# Patient Record
Sex: Male | Born: 1940 | Race: White | Hispanic: No | Smoking: Former smoker
Health system: Southern US, Community
[De-identification: ages and names within clinical notes are randomized; demographics above are authoritative.]

## PROBLEM LIST (undated history)

## (undated) DIAGNOSIS — E119 Type 2 diabetes mellitus without complications: Secondary | ICD-10-CM

## (undated) DIAGNOSIS — S42301A Unspecified fracture of shaft of humerus, right arm, initial encounter for closed fracture: Secondary | ICD-10-CM

## (undated) DIAGNOSIS — I10 Essential (primary) hypertension: Secondary | ICD-10-CM

## (undated) DIAGNOSIS — I639 Cerebral infarction, unspecified: Secondary | ICD-10-CM

## (undated) DIAGNOSIS — J189 Pneumonia, unspecified organism: Secondary | ICD-10-CM

## (undated) DIAGNOSIS — C801 Malignant (primary) neoplasm, unspecified: Secondary | ICD-10-CM

## (undated) HISTORY — PX: SKIN CANCER EXCISION: SHX779

## (undated) HISTORY — PX: OTHER SURGICAL HISTORY: SHX169

## (undated) HISTORY — PX: CHOLECYSTECTOMY: SHX55

## (undated) HISTORY — PX: BACK SURGERY: SHX140

## (undated) HISTORY — PX: KNEE SURGERY: SHX244

## (undated) HISTORY — PX: TONSILLECTOMY: SUR1361

## (undated) HISTORY — PX: NECK SURGERY: SHX720

## (undated) HISTORY — PX: HERNIA REPAIR: SHX51

---

## 1998-04-13 ENCOUNTER — Ambulatory Visit (HOSPITAL_COMMUNITY): Admission: RE | Admit: 1998-04-13 | Discharge: 1998-04-13 | Payer: Self-pay | Admitting: *Deleted

## 1998-08-08 ENCOUNTER — Inpatient Hospital Stay (HOSPITAL_COMMUNITY): Admission: RE | Admit: 1998-08-08 | Discharge: 1998-08-10 | Payer: Self-pay | Admitting: Neurosurgery

## 2000-06-07 ENCOUNTER — Ambulatory Visit (HOSPITAL_COMMUNITY): Admission: RE | Admit: 2000-06-07 | Discharge: 2000-06-07 | Payer: Self-pay | Admitting: Family Medicine

## 2000-06-07 ENCOUNTER — Encounter: Payer: Self-pay | Admitting: Family Medicine

## 2003-03-29 ENCOUNTER — Encounter: Payer: Self-pay | Admitting: Emergency Medicine

## 2003-03-29 ENCOUNTER — Emergency Department (HOSPITAL_COMMUNITY): Admission: EM | Admit: 2003-03-29 | Discharge: 2003-03-29 | Payer: Self-pay | Admitting: Emergency Medicine

## 2003-04-13 ENCOUNTER — Encounter: Payer: Self-pay | Admitting: Otolaryngology

## 2003-04-13 ENCOUNTER — Ambulatory Visit (HOSPITAL_COMMUNITY): Admission: RE | Admit: 2003-04-13 | Discharge: 2003-04-13 | Payer: Self-pay | Admitting: Otolaryngology

## 2004-12-21 DIAGNOSIS — I639 Cerebral infarction, unspecified: Secondary | ICD-10-CM

## 2004-12-21 HISTORY — DX: Cerebral infarction, unspecified: I63.9

## 2005-06-15 ENCOUNTER — Emergency Department (HOSPITAL_COMMUNITY): Admission: EM | Admit: 2005-06-15 | Discharge: 2005-06-16 | Payer: Self-pay | Admitting: *Deleted

## 2005-07-29 ENCOUNTER — Ambulatory Visit: Payer: Self-pay | Admitting: Family Medicine

## 2005-09-16 ENCOUNTER — Ambulatory Visit: Payer: Self-pay | Admitting: Family Medicine

## 2005-11-11 ENCOUNTER — Ambulatory Visit: Payer: Self-pay | Admitting: Family Medicine

## 2005-12-30 ENCOUNTER — Ambulatory Visit: Payer: Self-pay | Admitting: Family Medicine

## 2006-03-30 ENCOUNTER — Ambulatory Visit: Payer: Self-pay | Admitting: Family Medicine

## 2006-08-02 ENCOUNTER — Ambulatory Visit: Payer: Self-pay | Admitting: Family Medicine

## 2006-12-09 ENCOUNTER — Ambulatory Visit: Payer: Self-pay | Admitting: Family Medicine

## 2007-02-24 ENCOUNTER — Ambulatory Visit: Payer: Self-pay | Admitting: Family Medicine

## 2007-03-08 ENCOUNTER — Ambulatory Visit: Payer: Self-pay | Admitting: Family Medicine

## 2007-03-16 ENCOUNTER — Ambulatory Visit: Payer: Self-pay | Admitting: Family Medicine

## 2007-03-31 ENCOUNTER — Ambulatory Visit: Payer: Self-pay | Admitting: Family Medicine

## 2007-04-08 ENCOUNTER — Ambulatory Visit (HOSPITAL_COMMUNITY): Admission: RE | Admit: 2007-04-08 | Discharge: 2007-04-08 | Payer: Self-pay | Admitting: Gastroenterology

## 2007-04-08 ENCOUNTER — Encounter (INDEPENDENT_AMBULATORY_CARE_PROVIDER_SITE_OTHER): Payer: Self-pay | Admitting: Specialist

## 2007-04-11 ENCOUNTER — Ambulatory Visit: Payer: Self-pay | Admitting: Family Medicine

## 2007-04-21 ENCOUNTER — Ambulatory Visit: Payer: Self-pay | Admitting: Family Medicine

## 2007-05-19 ENCOUNTER — Encounter: Payer: Self-pay | Admitting: Gastroenterology

## 2007-05-20 ENCOUNTER — Inpatient Hospital Stay (HOSPITAL_COMMUNITY): Admission: EM | Admit: 2007-05-20 | Discharge: 2007-05-21 | Payer: Self-pay | Admitting: Emergency Medicine

## 2007-05-20 ENCOUNTER — Encounter (INDEPENDENT_AMBULATORY_CARE_PROVIDER_SITE_OTHER): Payer: Self-pay | Admitting: Surgery

## 2007-05-31 ENCOUNTER — Ambulatory Visit: Payer: Self-pay | Admitting: Family Medicine

## 2007-06-26 ENCOUNTER — Emergency Department (HOSPITAL_COMMUNITY): Admission: EM | Admit: 2007-06-26 | Discharge: 2007-06-26 | Payer: Self-pay | Admitting: Emergency Medicine

## 2007-09-22 ENCOUNTER — Encounter: Admission: RE | Admit: 2007-09-22 | Discharge: 2007-09-22 | Payer: Self-pay | Admitting: Gastroenterology

## 2011-05-05 NOTE — Consult Note (Signed)
NAME:  George Jefferson, George Jefferson NO.:  [id number]   MEDICAL RECORD NO.:  [id number]          PATIENT TYPE:  INP   LOCATION:  6707                         FACILITY:  [facility]   PHYSICIAN:  Sharlet Salina T. Hoxworth, M.D.DATE OF BIRTH:  1941-02-21   DATE OF CONSULTATION:  05/19/2007  DATE OF DISCHARGE:                                 CONSULTATION   CHIEF COMPLAINT:  Abdominal pain and nausea   HISTORY OF PRESENT ILLNESS:  I was asked by Dr. Deretha Emory in the  emergency room to evaluate George Jefferson.  George Jefferson is a 70 year old male who  presents with worsening upper abdominal and right upper quadrant  abdominal pain and nausea.  George Jefferson states George Jefferson first began to have  intermittent abdominal pain in November of last year.  George Jefferson presented to  the emergency room at [facility] and was treated symptomatically  with acute upper abdominal pain.  This did not significantly recur until  late February or March of this year.  At that time George Jefferson developed flu  and subsequent pneumonia and received several courses of antibiotics for  a diagnosis of pneumonia.  At that time George Jefferson began to have increasingly  severe intermittent abdominal pain and nausea.  This was initially felt  to be secondary to antibiotics by his treating physicians.  However, his  pain has steadily worsened.  George Jefferson describes pressure or aching epigastric  and right upper quadrant pain associated with nausea.  The pain is made  much worse by eating.  It has become increasingly frequent severe and  over the past week has been essentially constant, although it is not as  bad if George Jefferson does not eat.  George Jefferson has quite a bit of nausea, but no vomiting.  George Jefferson has been constipated.  The patient has been seen by his primary  doctor, Dr. Lysbeth Galas and was referred to Dr. Loreta Ave.  A colonoscopy has been  performed which apparently just showed a small polyp, but was otherwise  negative.  George Jefferson represented today with increasing pain and was sent to the  emergency room for an  ultrasound which shows gallstones as described  below.  George Jefferson has not had any fever or chills.  No jaundice noted.  George Jefferson has  not had any cough, sputum production or shortness of breath.   PAST SURGICAL HISTORY:  1. Neck fusion x2.  2. Lumbar laminectomy and history of spinal stenosis.  3. Right carotid endarterectomy following a CVA.  4. Left inguinal hernia.  5. Tonsillectomy.  6. Knee arthroscopy.   PAST MEDICAL HISTORY:  1. Hypertension.  2. Elevated cholesterol.  3. History of CVA as above.   CURRENT MEDICATIONS:  Aspirin, folic acid, Toprol, Vytorin.   ALLERGIES:  None.   SOCIAL HISTORY:  George Jefferson is married and accompanied by his wife.  No  cigarette or alcohol use.   FAMILY HISTORY:  Noncontributory.   REVIEW OF SYSTEMS:  GENERAL:  Denies fever, chills.  RESPIRATORY:  Denies cough, sputum, shortness of breath.  CARDIOVASCULAR:  Chest pain,  palpitations, swelling or history of heart disease.  GI:  As above.  GU:  No urinary burning, frequency, blood.  MUSCULOSKELETAL:  George Jefferson has some  chronic back and joint pain.  NEURO:  No residual problems from stroke,  specifically numbness, weakness.  No syncope.   PHYSICAL EXAMINATION:  VITAL SIGNS:  Temperature is 97, heart rate 84,  blood pressure 164/82, respirations 18.  GENERAL:  Mildly overweight white male in no acute distress.  SKIN:  Warm and dry.  No rash or infection.  HEENT:  No palpable masses or thyromegaly.  Well-healed right neck  incision.  Sclerae nonicteric.  Nares and oropharynx clear.  LUNGS:  Clear bilaterally with no crackles or wheezing.  No increased  work of breathing.  CARDIAC:  Regular rate and rhythm.  No murmurs.  Peripheral pulses  intact.  No JVD.  No edema.  LYMPH NODES:  No cervical or subclavicular nodes palpable.  ABDOMEN:  Mildly obese.  There is localized epigastric and right upper  quadrant tenderness with some guarding.  No palpable masses or  hepatosplenomegaly.  EXTREMITIES:  No joint swelling,  deformity.  NEUROLOGIC:  Alert and oriented.  Motor and sensory is grossly normal.   LABORATORY:  White count normal at 6.7, hemoglobin 13.9, platelets 236.  Lipase 22, amylase 52.  Electrolytes and LFTs all within normal limits.   I reviewed the ultrasound which shows multiple small gallstones, no  gallbladder wall thickening or pericholecystic fluid, common bile duct  is normal.  A chest x-ray shows some haziness in the right lower lobe,  possible pneumonia.   ASSESSMENT/PLAN:  Worsening abdominal pain, nausea and now tenderness  all consistent with gallbladder disease.  George Jefferson has gallstones and likely  ongoing biliary colic or chronic cholecystitis, but I see no evidence of  acute cholecystitis on ultrasound or in regards to his lab work.  Chest  x-ray raises the possibility of right lower lobe pneumonia, although  again there is no fever, white count and chest exam is unremarkable.   The patient is being admitted to the medicine service.  George Jefferson will need  laparoscopic cholecystectomy and cholangiogram as his pneumonia is  improved or ruled out by medicine.      Lorne Skeens. Hoxworth, M.D.  Electronically Signed     BTH/MEDQ  D:  05/20/2007  T:  05/20/2007  Job:  [id number]

## 2011-05-05 NOTE — Discharge Summary (Signed)
NAME:  George Jefferson, George Jefferson NO.:  [id number]   MEDICAL RECORD NO.:  [id number]          PATIENT TYPE:  INP   LOCATION:  6707                         FACILITY:  [facility]   PHYSICIAN:  Ladell Pier, M.D.   DATE OF BIRTH:  29-Apr-1941   DATE OF ADMISSION:  05/19/2007  DATE OF DISCHARGE:  05/21/2007                               DISCHARGE SUMMARY   DISCHARGE DIAGNOSES:  1. Right upper quadrant pain secondary to gallbladder disease.  2. Pneumonia.  3. 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  4. Hypertension.  5. Dyslipidemia.   PAST SURGICAL HISTORY:  1. Neck surgery.  2. Back surgery.  3. Left inguinal hernia repair.  4. Left knee arthroscopy.  5. Tonsillectomy.  6. Right carotid endarterectomy, 2006.   DISCHARGE MEDICATIONS:  1. Aspirin 81 mg daily.  2. Folic acid 1 mg daily.  3. Vytorin 10/10 q.h.s.  4. Toprol XL 25 mg daily.  5. Mag citrate as needed.  6. Levaquin 500 mg daily for 7 days.  7. Vicodin p.r.n. for pain.   FOLLOWUP APPOINTMENTS:  1. The patient to follow up with general surgery in 3 weeks.  2. The patient to follow up with primary care physician in 1-2 weeks.   PROCEDURES:  Status post laparoscopic cholecystectomy.   CONSULTANTS:  General surgery.   HISTORY OF PRESENT ILLNESS:  The patient is a 70 year old white male who  came in with right upper quadrant pain, worse with eating.  He had seen  Dr. Loreta Ave and had a colonoscopy that was normal.  In February, he had  some flu-like symptoms and then developed pneumonia.  He had an  ultrasound in the emergency room that showed gallstones.   PAST MEDICAL HISTORY:  Per admission H&P.   FAMILY HISTORY:  Per admission H&P.   SOCIAL HISTORY:  Per admission H&P.   MEDICATIONS:  Per admission H&P.   ALLERGIES:  Per admission H&P.   REVIEW OF SYSTEMS:  Per admission H&P.   PHYSICAL EXAMINATION ON DISCHARGE:  VITAL SIGNS:  Temperature 97.7,  pulse 90, respirations 20, blood pressure 131/72, pulse of 92% on room  air.  HEENT:  Normocephalic, atraumatic.  Pupils equal, round, reactive to  light without erythema.  CARDIOVASCULAR:  Regular rate and rhythm.  LUNGS:  Clear bilaterally.  ABDOMEN:  Positive bowel sounds.  EXTREMITIES:  Without edema.   HOSPITAL COURSE:  1. Right upper quadrant pain - gallstones.  The patient was admitted.      He was taken to the OR the next day, and he had a cholecystectomy.      He did well with the procedure and general surgery thought the      patient was okay for discharge.  The patient was discharged home      with pain medication p.r.n.  2. Pneumonia.  He had a chest x-ray that showed infiltrate consistent      with pneumonia and followed up CAT  scan also showed pneumonia,      diffuse reticular nodular interstitial lung disease, COPD,      emphysema, chronic bronchitis, patchy opacity in the right middle      lobe and superior segment lobe looking like inflammation.  He was      treated with Levaquin in house and discharged on Levaquin for 7      days.  3. Hypertension.  He was continued on antihypertensive medications.      Blood pressure was stable on discharge.   DISCHARGE LABORATORY:  Blood cultures negative x2.  Sodium 135,  potassium 4.5, chloride 101, CO2 27, glucose 92, BUN 10, creatinine  0.96.  LFTs normal. WBC 6.2, hemoglobin 13.1, platelets 205.  Cholangiogram during surgery was normal.  Chest x-ray showed improved  right middle lobe variation and chest CT as mentioned above.  Ultrasound  showed sludge and small nonshadowing stones in the gallbladder, that  move freely with changes in position.      Ladell Pier, M.D.  Electronically Signed     NJ/MEDQ  D:  05/21/2007  T:  05/21/2007  Job:  [id number]   cc:   Delaney Meigs, M.D.

## 2011-05-05 NOTE — Op Note (Signed)
NAME:  George Jefferson, George Jefferson NO.:  [id number]   MEDICAL RECORD NO.:  [id number]          PATIENT TYPE:  INP   LOCATION:  6707                         FACILITY:  [facility]   PHYSICIAN:  Wilmon Arms. Corliss Skains, M.D. DATE OF BIRTH:  [birth year]   DATE OF PROCEDURE:  05/20/2007  DATE OF DISCHARGE:                               OPERATIVE REPORT   PREOPERATIVE DIAGNOSIS:  Acute cholecystitis   POSTOPERATIVE DIAGNOSIS:  Acute cholecystitis.   PROCEDURE PERFORMED:  Laparoscopic cholecystectomy with intraoperative  cholangiogram.   SURGEON:  Wilmon Arms. Tsuei, M.D.   ANESTHESIA:  General endotracheal.   INDICATIONS:  The patient is a 70 year old male who presents with  intermittent upper abdominal pain and nausea.  He has undergone a  thorough workup.  The symptoms had become quite severe recently.  He  presents to the emergency apartment where an ultrasound showed  gallstones.  We were consulted, and the patient was admitted to  hospital.  The patient has had recent pneumonia.  His chest x-ray and  chest CT scan show some residual signs of pneumonia, but the patient is  completely asymptomatic without sign of fever or cough.  He was admitted  to hospital, placed on antibiotics and are coming to the room now for  cholecystectomy.   DESCRIPTION OF PROCEDURE:  The patient was brought to the operating room  and placed in the supine position on the operating room table.  After an  adequate level of general anesthesia was obtained, the patient's abdomen  was prepped with Betadine and draped in sterile fashion.  A time-out was  taken to assure the proper patient and proper procedure.  The transverse  incision was made below his umbilicus after infiltrating with 0.25%  Marcaine.  Dissection was carried down to the fascia which was opened  vertically.  A stay suture of Vicryl was placed around the fascial  opening.  The Hasson cannula was inserted and secured with the stay  suture.   Pneumoperitoneum was obtained by insufflating CO2, maintaining  maximum, pressure of 15 mmHg.  The patient was tilted into reverse  Trendelenburg position, tilted to his left.  The laparoscope was  inserted.  The gallbladder was visualized, and it seemed distended but  not thickened.  There were dense omental adhesions to the gallbladder.  A 10-mm port was placed in subxiphoid position, and two 5-mm ports were  placed in the right upper quadrant.  The gallbladder was grasped,  clamped and elevated.  A small hole was inadvertently created in the  fundus and the bile decompressing the abdomen.  This area was thoroughly  irrigated and suction.  We were able to peel down the adhesions to the  gallbladder.  We identified the cystic duct and dissected around it  circumferentially.  The duct was ligated with a clip distally.  A small  opening was created on the cystic duct.  The cholangiogram catheter was  inserted through a stab incision and threaded into the cystic duct.  A  cholangiogram was obtained which showed a small cystic duct and common  bile duct.  Contrast flowed  easily proximally and distally with good  flow into the duodenum with no filling defects.  This catheter was  removed.  The cystic duct was ligated with clips and divided.  The  cystic artery was ligated with clips and divided.  Cautery was then used  to remove the gallbladder from the liver.  The gallbladder was placed in  the EndoCatch sac.  We reinspected the gallbladder fossa for hemostasis.  The gallbladder fossa in the right upper quadrant was then thoroughly  irrigated free of bile until the irrigation fluid was clear.  Pneumoperitoneum was then released as ports were removed under direct  vision.  The stay suture was used to  tie down the umbilical fascia.  Four-0 Monocryl was used to close skin  incisions.  Steri-Strips and clean dressings were applied.  The patient  was extubated and brought to recovery in stable  condition.  All sponge,  instrument and needle counts were correct.      Wilmon Arms. Tsuei, M.D.  Electronically Signed     MKT/MEDQ  D:  05/20/2007  T:  05/21/2007  Job:  [id number]

## 2011-05-05 NOTE — H&P (Signed)
NAME:  George Jefferson, George Jefferson               ACCOUNT NO.:  [id number]   MEDICAL RECORD NO.:  [id number]          PATIENT TYPE:  INP   LOCATION:  1825                         FACILITY:  [facility]   PHYSICIAN:  Wilson Singer, M.D.DATE OF BIRTH:  01-Oct-1941   DATE OF ADMISSION:  05/19/2007  DATE OF DISCHARGE:                              HISTORY & PHYSICAL   HISTORY:  This 70 year old man comes in with a several month history of  right upper quadrant and right-sided abdominal pain, usually worse with  eating.  He has seen Dr. Loreta Ave, the gastroenterologist, who apparently  did a colonoscopy, which was normal.  In the last day or two, the pain  has gotten worse.  He also had a cough and flu-like illness in February  2008.  He does not have any symptoms such as cough, shortness of breath  or hemoptysis at the present time.  When he came to the emergency room,  an ultrasound of the abdomen showed the presence of gallstones.  Surgery  has already been consulted, and there is worry over a possible  pneumonia.   PAST MEDICAL HISTORY:  1. 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  2. Hypertension.  3. Hyperlipidemia.   PAST SURGICAL HISTORY:  Neck surgery, back surgery, left inguinal hernia  repair.  Left knee arthroscopy, tonsillectomy, right carotid  endarterectomy in 2006 as mentioned above.  Recent colonoscopy in April,  which showed polyps, which were removed and were benign by Dr. Loreta Ave.   MEDICATIONS:  1. Aspirin 1 tablet daily.  2. Folic acid 1 mg daily.  3. Toprol XL, dose unclear.  4. Vytorin 10/10 one tablet daily.   ALLERGIES:  None.   SOCIAL HISTORY:  He has been married for 46 years.  He quit smoking in  February 2008, and prior to that he had been smoking for 50 years.  He  does not drink alcohol.  He has been on disability because of neck and  back problems from  spinal stenosis.   FAMILY HISTORY:  Noncontributory.   REVIEW OF SYSTEMS:  Apart from the symptoms mentioned above, there are  no other symptoms referable to all systems reviewed.   PHYSICAL EXAMINATION:  VITAL SIGNS:  Temperature 97, blood pressure  164/92.  Pulse 84.  Respiratory rate 18.  Saturation 98%.  CARDIOVASCULAR:  Heart sounds are present and normal, without any  murmurs.  RESPIRATORY:  Lung fields are entirely clear.  There is no respiratory  distress.  ABDOMEN:  Soft, and tender in the right upper quadrant.  Bowel sounds  are present and normal.  NEUROLOGIC:  Alert and oriented, without any focal neurologic signs.   INVESTIGATIONS:  Chest x-ray is suggestive of possible right lower lobe  pneumonia.  Hemoglobin 13.9, white blood cell count 6.7, platelets 236.  Sodium 133, potassium 3.5, chloride 101, bicarbonate 26, glucose 96, BUN  12, creatinine 0.95, lipase 22.  Ultrasound of the abdomen as mentioned  above shows  presence of gallstones.   IMPRESSION:  1. Cholelithiasis.  2. Very questionable pneumonia.  I think that clinically he does not      have pneumonia, and I am not going to put him on antibiotics at the      present time.  3. Hypertension.   PLAN:  1. Admit.  2. CT chest scan for further evaluation of right lower lobe shadow.  3. Surgical consultation.  I am sure he will need cholecystectomy in      view of his symptoms from his gallstones.   Further recommendations will depend on the patient's hospital progress.      Wilson Singer, M.D.  Electronically Signed     NCG/MEDQ  D:  05/20/2007  T:  05/20/2007  Job:  [id number]

## 2011-05-08 NOTE — Op Note (Signed)
NAME:  George Jefferson, George Jefferson NO.:  [id number]   MEDICAL RECORD NO.:  [id number]          PATIENT TYPE:  AMB   LOCATION:  ENDO                         FACILITY:  [facility]   PHYSICIAN:  Anselmo Rod, M.D.  DATE OF BIRTH:  04/18/1941   DATE OF PROCEDURE:  DATE OF DISCHARGE:                               OPERATIVE REPORT   PROCEDURE PERFORMED:  Colonoscopy with snare polypectomy x2 (cold  snare).   ENDOSCOPIST:  Anselmo Rod. MD   INSTRUMENT USED:  Pentax video colonoscope.   INDICATION FOR PROCEDURE:  A 70 year old white male with a history of  change in bowel habits and a family history of colon cancer in his  mother, undergoing a screening colonoscopy to rule out colonic polyps,  masses, etc.   PREPROCEDURE PHYSICAL:  Informed consent was procured from the patient.  The patient was fasted for 8 hours prior to the procedure after being  prepped with a bottle of magnesium citrate and a gallon of NuLYTELY the  night prior to the procedure.  Risks and benefits of the procedure,  including a 10% miss rate of cancer and polyp, were discussed with the  patient as well.   PREPROCEDURE PHYSICAL:  VITAL SIGNS:  The patient had stable vital  signs.  NECK:  Supple.  CHEST clear to auscultation.  S1, S2 regular.  ABDOMEN:  Soft with normal bowel sounds.   DESCRIPTION OF PROCEDURE:  The patient was placed in the left lateral  decubitus position and sedated with 75 mcg of Fentanyl and 7.5 mg of  Versed given intravenously in slow incremental doses.  Once the patient  was adequately sedated and maintained on low-flow oxygen and continuous  cardiac monitoring, the Pentax video colonoscope was advanced from the  rectum to the cecum with difficulty.  There was significant amount of  residual stool in the colon.  Multiple washes were done.  Sigmoid  diverticulosis was noted.  Two small sessile polyps were removed by cold  snare from 20 cm.  One of the polyps was lost in stool.   Retroflexion in  the rectum revealed small internal hemorrhoids.  No other masses or  polyps were seen.  The terminal ileum appeared healthy and without  lesions.  The patient tolerated the procedure well without  complications.   IMPRESSION:  1. Sigmoid diverticulosis.  2. Two small sessile polyps removed from cold snare from 20 cm.  One      polyp was lost in stool.  3. Small internal hemorrhoids.  4. Normal terminal ileum.   RECOMMENDATIONS:  1. Await pathology results.  2. Avoid all nonsteroidals including aspirin for the next 2 weeks.  3. A high-fiber diet with liberal fluid intake has been advocated.  4. Outpatient follow-up in the next 2 weeks for further      recommendations.      Anselmo Rod, M.D.  Electronically Signed     JNM/MEDQ  D:  04/08/2007  T:  04/08/2007  Job:  [id number]   cc:   Delaney Meigs, M.D.

## 2011-07-22 DEATH — deceased

## 2011-10-06 LAB — DIFFERENTIAL
Basophils Absolute: 0
Basophils Relative: 0
Eosinophils Absolute: 0.1
Eosinophils Relative: 1

## 2011-10-06 LAB — URINALYSIS, ROUTINE W REFLEX MICROSCOPIC
Bilirubin Urine: NEGATIVE
Ketones, ur: NEGATIVE
Nitrite: NEGATIVE
Urobilinogen, UA: 0.2

## 2011-10-06 LAB — COMPREHENSIVE METABOLIC PANEL
ALT: 25
AST: 28
CO2: 26
Chloride: 101
GFR calc Af Amer: >60
GFR calc non Af Amer: >60
Sodium: 136
Total Bilirubin: 0.9

## 2011-10-06 LAB — CBC
Hemoglobin: 14.8
MCV: 94.7
RBC: 4.55
WBC: 9

## 2011-10-06 LAB — LIPASE, BLOOD: Lipase: 21

## 2015-01-30 ENCOUNTER — Other Ambulatory Visit: Payer: Self-pay | Admitting: Pharmacist

## 2017-09-23 ENCOUNTER — Emergency Department (HOSPITAL_COMMUNITY): Payer: Medicare Other

## 2017-09-23 ENCOUNTER — Encounter (HOSPITAL_COMMUNITY): Payer: Self-pay | Admitting: *Deleted

## 2017-09-23 ENCOUNTER — Emergency Department (HOSPITAL_COMMUNITY)
Admission: EM | Admit: 2017-09-23 | Discharge: 2017-09-23 | Disposition: A | Discharge status: Home / Self Care | Payer: Medicare Other | Attending: Emergency Medicine | Admitting: Emergency Medicine

## 2017-09-23 DIAGNOSIS — Z7984 Long term (current) use of oral hypoglycemic drugs: Secondary | ICD-10-CM | POA: Diagnosis not present

## 2017-09-23 DIAGNOSIS — N189 Chronic kidney disease, unspecified: Secondary | ICD-10-CM | POA: Diagnosis not present

## 2017-09-23 DIAGNOSIS — R0789 Other chest pain: Secondary | ICD-10-CM | POA: Insufficient documentation

## 2017-09-23 DIAGNOSIS — I129 Hypertensive chronic kidney disease with stage 1 through stage 4 chronic kidney disease, or unspecified chronic kidney disease: Secondary | ICD-10-CM | POA: Insufficient documentation

## 2017-09-23 DIAGNOSIS — Z87891 Personal history of nicotine dependence: Secondary | ICD-10-CM | POA: Diagnosis not present

## 2017-09-23 DIAGNOSIS — Z8673 Personal history of transient ischemic attack (TIA), and cerebral infarction without residual deficits: Secondary | ICD-10-CM | POA: Insufficient documentation

## 2017-09-23 DIAGNOSIS — Z79899 Other long term (current) drug therapy: Secondary | ICD-10-CM | POA: Insufficient documentation

## 2017-09-23 DIAGNOSIS — E119 Type 2 diabetes mellitus without complications: Secondary | ICD-10-CM | POA: Diagnosis not present

## 2017-09-23 DIAGNOSIS — Z7982 Long term (current) use of aspirin: Secondary | ICD-10-CM | POA: Insufficient documentation

## 2017-09-23 DIAGNOSIS — R079 Chest pain, unspecified: Secondary | ICD-10-CM | POA: Diagnosis present

## 2017-09-23 HISTORY — DX: Essential (primary) hypertension: I10

## 2017-09-23 HISTORY — DX: Type 2 diabetes mellitus without complications: E11.9

## 2017-09-23 HISTORY — DX: Cerebral infarction, unspecified: I63.9

## 2017-09-23 LAB — BASIC METABOLIC PANEL
Anion gap: 10 (ref 5–15)
BUN: 30 mg/dL — ABNORMAL HIGH (ref 6–20)
CALCIUM: 9.1 mg/dL (ref 8.9–10.3)
CO2: 24 mmol/L (ref 22–32)
CREATININE: 1.45 mg/dL — AB (ref 0.61–1.24)
Chloride: 99 mmol/L — ABNORMAL LOW (ref 101–111)
GFR calc non Af Amer: 46 mL/min — ABNORMAL LOW (ref >60)
GFR, EST AFRICAN AMERICAN: 53 mL/min — AB (ref >60)
Glucose, Bld: 94 mg/dL (ref 65–99)
Potassium: 5 mmol/L (ref 3.5–5.1)
SODIUM: 133 mmol/L — AB (ref 135–145)

## 2017-09-23 LAB — CBC
HCT: 39 % (ref 39.0–52.0)
Hemoglobin: 13.3 g/dL (ref 13.0–17.0)
MCH: 31.8 pg (ref 26.0–34.0)
MCHC: 34.1 g/dL (ref 30.0–36.0)
MCV: 93.3 fL (ref 78.0–100.0)
PLATELETS: 222 10*3/uL (ref 150–400)
RBC: 4.18 MIL/uL — AB (ref 4.22–5.81)
RDW: 13.1 % (ref 11.5–15.5)
WBC: 7.1 10*3/uL (ref 4.0–10.5)

## 2017-09-23 LAB — TROPONIN I

## 2017-09-23 MED ORDER — METOPROLOL SUCCINATE ER 50 MG PO TB24
50.0000 mg | ORAL_TABLET | Freq: Every day | ORAL | Status: DC
  Administered 2017-09-23: 50 mg via ORAL
  Filled 2017-09-23 (×3): qty 1

## 2017-09-23 NOTE — ED Triage Notes (Signed)
Pt comes in with chest pain starting today. Described as a pressure in his chest. States that 2 times today he had bilateral arm numbness for 10 minutes. Denies any numbness at this time. Pt denied any weakness in his arms, just numbness.   Family adds that patient lifted 2 toilets on and off of a truck yesterday and he does have history of cervical neck injury.

## 2017-09-23 NOTE — ED Provider Notes (Signed)
[facility] DE[facility] Provider Note   CSN: [id number] Arrival date & time: 09/23/17  1401     History   Chief Complaint Chief Complaint  Patient presents with  . Chest Pain    HPI George Jefferson is a 76 y.o. male.  HPI Patient had 2 episodes of chest pain today each lasting approximately 30 minutes, 3 by numbness in both hands. Chest pain is nonexertiona an onset at rest . He reports that he lifted 2 heavy toilet yesterday without any symptoms. He is presently asymptomatic without treatment. He denies any shortness of breath nausea or sweatiness. No other associated symptoms. The made symptoms better or worse Past Medical History:  Diagnosis Date  . Diabetes mellitus without complication ([place])   . Hypertension   . Stroke [facility])     There are no active problems to display for this patient.   Past Surgical History:  Procedure Laterality Date  . BACK SURGERY    . KNEE SURGERY Left   . NECK SURGERY         Home Medications    Prior to Admission medications   Medication Sig Start Date End Date Taking? Authorizing Provider  folic acid (FOLVITE) 1 MG tablet Take 1 tablet by mouth daily. 04/06/17  Yes [provider]  metFORMIN (GLUCOPHAGE-XR) 500 MG 24 hr tablet Take 2 tablets by mouth daily with breakfast. 04/01/17  Yes [provider]  hydrochlorothiazide (HYDRODIURIL) 25 MG tablet Take 1 tablet by mouth daily. 09/22/17   [provider]  lisinopril (PRINIVIL,ZESTRIL) 20 MG tablet Take 1 tablet by mouth daily. 09/22/17   [provider]  metoprolol succinate (TOPROL-XL) 50 MG 24 hr tablet Take 1 tablet by mouth 2 (two) times daily. 07/06/17   [provider]  simvastatin (ZOCOR) 80 MG tablet Take 1 tablet by mouth daily. 09/22/17   [provider]  Aspirin 325 mg daily  Family History No family history on file. father had MI age 35 Social History Social History  Substance Use Topics  . Smoking status: Former Research scientist (life sciences)  .  Smokeless tobacco: Never Used  . Alcohol use No   Ex-smoker quit 50 years ago  Allergies   Ciprofloxacin   Review of Systems Review of Systems  Constitutional: Negative.   HENT: Negative.   Respiratory: Negative.   Cardiovascular: Positive for chest pain.  Gastrointestinal: Negative.   Musculoskeletal: Negative.   Skin: Negative.   Allergic/Immunologic: Positive for immunocompromised state.       Diabetic  Neurological: Positive for numbness.  Psychiatric/Behavioral: Negative.   All other systems reviewed and are negative.    Physical Exam Updated Vital Signs BP (!) 193/97   Pulse 71   Temp 97.9 F (36.6 C) (Oral)   Resp 18   Ht 6' (1.829 m)   Wt 111.1 kg (245 lb)   SpO2 96%   BMI 33.23 kg/m   Physical Exam  Constitutional: He appears well-developed and well-nourished.  HENT:  Head: Normocephalic and atraumatic.  Eyes: Pupils are equal, round, and reactive to light. Conjunctivae are normal.  Neck: Neck supple. No tracheal deviation present. No thyromegaly present.  Cardiovascular: Normal rate and regular rhythm.   No murmur heard. Pulmonary/Chest: Effort normal and breath sounds normal.  Abdominal: Soft. Bowel sounds are normal. He exhibits no distension. There is no tenderness.  Musculoskeletal: Normal range of motion. He exhibits no edema or tenderness.  Neurological: He is alert. Coordination normal.  Skin: Skin is warm and dry. No rash noted.  Psychiatric: He has a normal mood and affect.  Nursing note and vitals reviewed.    ED Treatments / Results  Labs (all labs ordered are listed, but only abnormal results are displayed) Labs Reviewed  BASIC METABOLIC PANEL - Abnormal; Notable for the following:       Result Value   Sodium 133 (*)    Chloride 99 (*)    BUN 30 (*)    Creatinine, Ser 1.45 (*)    GFR calc non Af Amer 46 (*)    GFR calc Af Amer 53 (*)    All other components within normal limits  CBC - Abnormal; Notable for the following:     RBC 4.18 (*)    All other components within normal limits  TROPONIN I  TROPONIN I    EKG  EKG Interpretation  Date/Time:  Thursday September 23 2017 14:09:30 EDT Ventricular Rate:  70 PR Interval:  164 QRS Duration: 106 QT Interval:  406 QTC Calculation: 438 R Axis:   81 Text Interpretation:  Normal sinus rhythm Normal ECG Since last tracing QT now normal Confirmed by Daleen Bo [phone]) on 09/23/2017 3:14:37 PM      Chest x-ray viewed by me Radiology Dg Chest 2 View  Result Date: 09/23/2017 CLINICAL DATA:  Chest pain EXAM: CHEST  2 VIEW COMPARISON:  05/19/2017 FINDINGS: Cardiac shadow is stable. Degenerative changes of thoracic spine are noted. The lungs are well aerated bilaterally. No focal infiltrate or sizable effusion is seen. IMPRESSION: No acute abnormality noted. Electronically Signed   By: Inez Catalina M.D.   On: 09/23/2017 14:36    Procedures Procedures (including critical care time)  Medications Ordered in ED Medications - No data to display  Chest x-ray viewed by me Results for orders placed or performed during the hospital encounter of 56/21/30  Basic metabolic panel  Result Value Ref Range   Sodium 133 (L) 135 - 145 mmol/L   Potassium 5.0 3.5 - 5.1 mmol/L   Chloride 99 (L) 101 - 111 mmol/L   CO2 24 22 - 32 mmol/L   Glucose, Bld 94 65 - 99 mg/dL   BUN 30 (H) 6 - 20 mg/dL   Creatinine, Ser 1.45 (H) 0.61 - 1.24 mg/dL   Calcium 9.1 8.9 - 10.3 mg/dL   GFR calc non Af Amer 46 (L) >60 mL/min   GFR calc Af Amer 53 (L) >60 mL/min   Anion gap 10 5 - 15  CBC  Result Value Ref Range   WBC 7.1 4.0 - 10.5 K/uL   RBC 4.18 (L) 4.22 - 5.81 MIL/uL   Hemoglobin 13.3 13.0 - 17.0 g/dL   HCT 39.0 39.0 - 52.0 %   MCV 93.3 78.0 - 100.0 fL   MCH 31.8 26.0 - 34.0 pg   MCHC 34.1 30.0 - 36.0 g/dL   RDW 13.1 11.5 - 15.5 %   Platelets 222 150 - 400 K/uL  Troponin I  Result Value Ref Range   Troponin I <0.03 <0.03 ng/mL  Troponin I  Result Value Ref Range   Troponin I  <0.03 <0.03 ng/mL   Dg Chest 2 View  Result Date: 09/23/2017 CLINICAL DATA:  Chest pain EXAM: CHEST  2 VIEW COMPARISON:  05/19/2017 FINDINGS: Cardiac shadow is stable. Degenerative changes of thoracic spine are noted. The lungs are well aerated bilaterally. No focal infiltrate or sizable effusion is seen. IMPRESSION: No acute abnormality noted. Electronically Signed   By: Inez Catalina M.D.   On: 09/23/2017 14:36  Initial Impression / Assessment and Plan / ED Course  I have reviewed the triage vital signs and the nursing notes.  Pertinent labs & imaging results that were available during my care of the patient were reviewed by me and considered in my medical decision making (see chart for details).     8:35 PM patient remains asymptomatic. Treated with Toprol, his usual evening medication. Heart score equals 4. Story atypical for acute coronary syndrome. I did offer hospitalization the patient which he declined. Instead he wishes referral to cardiologist as outpatient. I've referred him to [facility] heart care. He is advised to return to the emergency department immediately for worsening or recurrent chest pain Final Clinical Impressions(s) / ED Diagnoses  Diagnosis #1 atypical chest pain #2 elevated blood pressure #3 renal insufficiency Final diagnoses:  None    New Prescriptions New Prescriptions   No medications on file     Orlie Dakin, MD 09/23/17 2042

## 2017-09-23 NOTE — ED Notes (Signed)
Pt verbalized understanding of discharge instructions. Pt ambulatory out to car with no assistance.

## 2017-09-23 NOTE — Discharge Instructions (Signed)
Call [place] heart care office tomorrow to schedule next available appointment. Tell office staff that you were seen here. If he developed worsening or recurrent chest pain, call 911 or return to the emergency Department immediately if your blood pressure should be rechecked within a week. Today's was elevated at 162/73

## 2017-10-28 ENCOUNTER — Encounter: Payer: Self-pay | Admitting: Cardiovascular Disease

## 2017-10-28 ENCOUNTER — Ambulatory Visit: Payer: Medicare Other | Admitting: Cardiovascular Disease

## 2017-10-28 VITALS — BP 132/76 | HR 76 | Ht 72.0 in | Wt 248.0 lb

## 2017-10-28 DIAGNOSIS — I10 Essential (primary) hypertension: Secondary | ICD-10-CM

## 2017-10-28 DIAGNOSIS — R079 Chest pain, unspecified: Secondary | ICD-10-CM

## 2017-10-28 DIAGNOSIS — N183 Chronic kidney disease, stage 3 unspecified: Secondary | ICD-10-CM

## 2017-10-28 DIAGNOSIS — E119 Type 2 diabetes mellitus without complications: Secondary | ICD-10-CM

## 2017-10-28 DIAGNOSIS — E785 Hyperlipidemia, unspecified: Secondary | ICD-10-CM | POA: Diagnosis not present

## 2017-10-28 DIAGNOSIS — Z9289 Personal history of other medical treatment: Secondary | ICD-10-CM | POA: Diagnosis not present

## 2017-10-28 NOTE — Progress Notes (Signed)
CARDIOLOGY CONSULT NOTE  Patient ID: George Jefferson MRN: [id number] DOB/AGE: 07-31-1941 76 y.o.  Admit date: (Not on file) Primary Physician: Dione Housekeeper, MD Referring Physician: Dr. Winfred Leeds   Reason for Consultation: Chest pain  HPI: George Jefferson is a 76 y.o. male who is being seen today for the evaluation of chest pain at the request of Dr. Winfred Leeds.  Past medical history includes hypertension, type 2 diabetes, and hyperlipidemia.  He was evaluated in the ED on 09/23/17 for chest pain.  2 episodes occurred, each lasting about 30 minutes.  It occurred while at rest.  There was no associated shortness of breath, nausea, or diaphoresis.  I reviewed all relevant labs, studies, documentation.  It was deemed his symptoms were atypical for an acute coronary syndrome.  Hospitalization was offered but the patient declined.  Troponins were normal.  Sodium was mildly low at 133, BUN 30, creatinine 1.45.  White blood cells and hemoglobin were normal.  Chest x-ray showed no acute abnormalities.  ECG which I personally reviewed demonstrated sinus rhythm with no ischemic ST segment or T wave abnormalities, nor any arrhythmias.  He has not had any further episodes of chest pain.  He also denies shortness of breath, fatigue, leg swelling, palpitations, orthopnea, paroxysmal nocturnal dyspnea.  He said on that day, he had been doing some heavy lifting and wondered if he pulled a muscle.  He had a stroke in 2006 and quit smoking at that time.  His mother lived to the age of 60.  She had diabetes.   Allergies  Allergen Reactions  . Ciprofloxacin     "Liked to eat my stomach out."     Current Outpatient Medications  Medication Sig Dispense Refill  . aspirin 325 MG tablet Take 325 mg by mouth daily.    . folic acid (FOLVITE) 1 MG tablet Take 1 tablet by mouth daily.    . hydrochlorothiazide (HYDRODIURIL) 25 MG tablet Take 1 tablet by mouth daily.    Marland Kitchen lisinopril  (PRINIVIL,ZESTRIL) 20 MG tablet Take 1 tablet by mouth daily.    . metFORMIN (GLUCOPHAGE-XR) 500 MG 24 hr tablet Take 500 mg by mouth daily with breakfast.     . metoprolol succinate (TOPROL-XL) 50 MG 24 hr tablet Take 1 tablet by mouth 2 (two) times daily.    . Omega-3 Fatty Acids (FISH OIL) 1000 MG CAPS Take 1 capsule by mouth daily.    . simvastatin (ZOCOR) 80 MG tablet Take 1 tablet by mouth daily.     No current facility-administered medications for this visit.     Past Medical History:  Diagnosis Date  . Diabetes mellitus without complication ([place])   . Hypertension   . Stroke [facility])     Past Surgical History:  Procedure Laterality Date  . BACK SURGERY    . KNEE SURGERY Left   . NECK SURGERY      Social History   Socioeconomic History  . Marital status: Married    Spouse name: Not on file  . Number of children: Not on file  . Years of education: Not on file  . Highest education level: Not on file  Social Needs  . Financial resource strain: Not on file  . Food insecurity - worry: Not on file  . Food insecurity - inability: Not on file  . Transportation needs - medical: Not on file  . Transportation needs - non-medical: Not on file  Occupational History  . Not on  file  Tobacco Use  . Smoking status: Former Smoker    Last attempt to quit: 12/21/2005    Years since quitting: 11.8  . Smokeless tobacco: Never Used  Substance and Sexual Activity  . Alcohol use: No  . Drug use: No  . Sexual activity: Not on file  Other Topics Concern  . Not on file  Social History Narrative  . Not on file     No family history of premature CAD in 1st degree relatives.  Current Meds  Medication Sig  . aspirin 325 MG tablet Take 325 mg by mouth daily.  . folic acid (FOLVITE) 1 MG tablet Take 1 tablet by mouth daily.  . hydrochlorothiazide (HYDRODIURIL) 25 MG tablet Take 1 tablet by mouth daily.  Marland Kitchen lisinopril (PRINIVIL,ZESTRIL) 20 MG tablet Take 1 tablet by mouth daily.  .  metFORMIN (GLUCOPHAGE-XR) 500 MG 24 hr tablet Take 500 mg by mouth daily with breakfast.   . metoprolol succinate (TOPROL-XL) 50 MG 24 hr tablet Take 1 tablet by mouth 2 (two) times daily.  . Omega-3 Fatty Acids (FISH OIL) 1000 MG CAPS Take 1 capsule by mouth daily.  . simvastatin (ZOCOR) 80 MG tablet Take 1 tablet by mouth daily.      Review of systems complete and found to be negative unless listed above in HPI    Physical exam Blood pressure 132/76, pulse 76, height 6' (1.829 m), weight 248 lb (112.5 kg), SpO2 96 %. General: NAD Neck: No JVD, no thyromegaly or thyroid nodule.  Lungs: Clear to auscultation bilaterally with normal respiratory effort. CV: Nondisplaced PMI. Regular rate and rhythm, normal S1/S2, no S3/S4, no murmur.  No peripheral edema.  No carotid bruit.    Abdomen: Soft, nontender, no distention.  Skin: Intact without lesions or rashes.  Neurologic: Alert and oriented x 3.  Psych: Normal affect. Extremities: No clubbing or cyanosis.  HEENT: Normal.   ECG: Most recent ECG reviewed.   Labs: Lab Results  Component Value Date/Time   K 5.0 09/23/2017 02:41 PM   BUN 30 (H) 09/23/2017 02:41 PM   CREATININE 1.45 (H) 09/23/2017 02:41 PM   ALT 25 06/26/2007 01:31 PM   HGB 13.3 09/23/2017 02:41 PM     Lipids: No results found for: LDLCALC, LDLDIRECT, CHOL, TRIG, HDL      ASSESSMENT AND PLAN:  1.  Chest pain: This was an isolated instance.  He has had no further symptoms since then and no symptoms prior to that.  His blood pressure is controlled.  We talked about potentially doing a stress test or waiting to see if symptoms recur.  He would prefer to wait and I think this is reasonable.  2.  Hypertension: Controlled.  No change therapy.  3.  Hypercholesterolemia: Continue simvastatin 80 mg daily.  4.  Type II diabetes: He takes metformin 500 mg daily.     Disposition: Follow up prn    Signed: Kate Sable, M.D., F.A.C.C.  10/28/2017, 9:34 AM

## 2017-10-28 NOTE — Patient Instructions (Signed)
Your physician recommends that you schedule a follow-up appointment in:  As needed with Dr.Koneswaran    Your physician recommends that you continue on your current medications as directed. Please refer to the Current Medication list given to you today.     No lab work or tests ordered today.       Thank you for choosing [facility] !         

## 2018-08-26 ENCOUNTER — Encounter (HOSPITAL_COMMUNITY): Payer: Self-pay | Admitting: Emergency Medicine

## 2018-08-26 ENCOUNTER — Other Ambulatory Visit: Payer: Self-pay

## 2018-08-26 ENCOUNTER — Emergency Department (HOSPITAL_COMMUNITY): Payer: Medicare Other

## 2018-08-26 ENCOUNTER — Emergency Department (HOSPITAL_COMMUNITY)
Admission: EM | Admit: 2018-08-26 | Discharge: 2018-08-26 | Disposition: A | Discharge status: Home / Self Care | Payer: Medicare Other | Attending: Emergency Medicine | Admitting: Emergency Medicine

## 2018-08-26 DIAGNOSIS — Z7982 Long term (current) use of aspirin: Secondary | ICD-10-CM | POA: Insufficient documentation

## 2018-08-26 DIAGNOSIS — Z87891 Personal history of nicotine dependence: Secondary | ICD-10-CM | POA: Diagnosis not present

## 2018-08-26 DIAGNOSIS — E119 Type 2 diabetes mellitus without complications: Secondary | ICD-10-CM | POA: Insufficient documentation

## 2018-08-26 DIAGNOSIS — I10 Essential (primary) hypertension: Secondary | ICD-10-CM | POA: Diagnosis not present

## 2018-08-26 DIAGNOSIS — R05 Cough: Secondary | ICD-10-CM | POA: Diagnosis present

## 2018-08-26 DIAGNOSIS — Z7984 Long term (current) use of oral hypoglycemic drugs: Secondary | ICD-10-CM | POA: Diagnosis not present

## 2018-08-26 DIAGNOSIS — J209 Acute bronchitis, unspecified: Secondary | ICD-10-CM | POA: Insufficient documentation

## 2018-08-26 DIAGNOSIS — R062 Wheezing: Secondary | ICD-10-CM | POA: Insufficient documentation

## 2018-08-26 DIAGNOSIS — Z79899 Other long term (current) drug therapy: Secondary | ICD-10-CM | POA: Insufficient documentation

## 2018-08-26 DIAGNOSIS — J40 Bronchitis, not specified as acute or chronic: Secondary | ICD-10-CM

## 2018-08-26 LAB — CBC WITH DIFFERENTIAL/PLATELET
BASOS PCT: 0 %
Basophils Absolute: 0 10*3/uL (ref 0.0–0.1)
EOS ABS: 0.3 10*3/uL (ref 0.0–0.7)
EOS PCT: 3 %
HCT: 35.5 % — ABNORMAL LOW (ref 39.0–52.0)
Hemoglobin: 12.1 g/dL — ABNORMAL LOW (ref 13.0–17.0)
LYMPHS ABS: 2.5 10*3/uL (ref 0.7–4.0)
Lymphocytes Relative: 27 %
MCH: 31.8 pg (ref 26.0–34.0)
MCHC: 34.1 g/dL (ref 30.0–36.0)
MCV: 93.2 fL (ref 78.0–100.0)
MONO ABS: 0.8 10*3/uL (ref 0.1–1.0)
MONOS PCT: 8 %
Neutro Abs: 5.9 10*3/uL (ref 1.7–7.7)
Neutrophils Relative %: 62 %
Platelets: 236 10*3/uL (ref 150–400)
RBC: 3.81 MIL/uL — ABNORMAL LOW (ref 4.22–5.81)
RDW: 13.6 % (ref 11.5–15.5)
WBC: 9.4 10*3/uL (ref 4.0–10.5)

## 2018-08-26 LAB — BASIC METABOLIC PANEL
Anion gap: 8 (ref 5–15)
BUN: 41 mg/dL — AB (ref 8–23)
CALCIUM: 8.6 mg/dL — AB (ref 8.9–10.3)
CHLORIDE: 104 mmol/L (ref 98–111)
CO2: 24 mmol/L (ref 22–32)
CREATININE: 1.68 mg/dL — AB (ref 0.61–1.24)
GFR calc non Af Amer: 38 mL/min — ABNORMAL LOW (ref >60)
GFR, EST AFRICAN AMERICAN: 44 mL/min — AB (ref >60)
Glucose, Bld: 87 mg/dL (ref 70–99)
Potassium: 4.7 mmol/L (ref 3.5–5.1)
SODIUM: 136 mmol/L (ref 135–145)

## 2018-08-26 LAB — BRAIN NATRIURETIC PEPTIDE: B NATRIURETIC PEPTIDE 5: 64 pg/mL (ref 0.0–100.0)

## 2018-08-26 LAB — TROPONIN I

## 2018-08-26 MED ORDER — ALBUTEROL SULFATE HFA 108 (90 BASE) MCG/ACT IN AERS
1.0000 | INHALATION_SPRAY | Freq: Once | RESPIRATORY_TRACT | Status: AC
  Administered 2018-08-26: 2 via RESPIRATORY_TRACT
  Filled 2018-08-26: qty 6.7

## 2018-08-26 MED ORDER — IPRATROPIUM-ALBUTEROL 0.5-2.5 (3) MG/3ML IN SOLN
3.0000 mL | Freq: Once | RESPIRATORY_TRACT | Status: AC
  Administered 2018-08-26: 3 mL via RESPIRATORY_TRACT
  Filled 2018-08-26: qty 3

## 2018-08-26 MED ORDER — PREDNISONE 20 MG PO TABS: 40.0000 mg | ORAL_TABLET | Freq: Every day | ORAL | 0 refills | Status: DC

## 2018-08-26 MED ORDER — ALBUTEROL SULFATE (2.5 MG/3ML) 0.083% IN NEBU
2.5000 mg | INHALATION_SOLUTION | Freq: Once | RESPIRATORY_TRACT | Status: AC
  Administered 2018-08-26: 2.5 mg via RESPIRATORY_TRACT
  Filled 2018-08-26: qty 3

## 2018-08-26 NOTE — ED Triage Notes (Addendum)
Pt sent over by PCP for on-going productive cough since 8/20, subjective fever, and general malaise. Pt has been on antibiotics for 3 days. PCP concerned for pneumonia.

## 2018-08-26 NOTE — Discharge Instructions (Addendum)
Finish taking your antibiotic as directed until its finished.  1-2 puffs of the albuterol inhaler every 4-6 hrs as needed.  Follow-up with your primary doctor for recheck.  Check your blood sugar daily and stop the prednisone if your blood sugars become elevated. Your kidney function tests today are elevated, this may be related to your diabetes.  You will need to discuss this with your primary doctor.

## 2018-08-26 NOTE — ED Provider Notes (Signed)
[facility] EMERGENCY DEPARTMENT Provider Note   CSN: [id number] Arrival date & time: 08/26/18  1005   History   Chief Complaint Chief Complaint  Patient presents with  . Cough    HPI LAWRANCE WIEDEMANN is a 77 y.o. male.  HPI  LEDGER HEINDL is a 77 y.o. male who presents to the Emergency Department complaining of intermittently productive cough for 2 weeks, fatigue, and subjective fever.  He was seen by his PCP earlier this week and started on Zithromax.  Seen again and sent here for further evaluation for possible pneumonia.  He states the cough is productive of yellow sputum.  He denies shortness of breath, chest pain, vomiting, extremity edema.  No hx of PNA. Possible sick contacts.     Past Medical History:  Diagnosis Date  . Diabetes mellitus without complication ([place])   . Hypertension   . Stroke [facility])     There are no active problems to display for this patient.   Past Surgical History:  Procedure Laterality Date  . BACK SURGERY    . KNEE SURGERY Left   . NECK SURGERY        Home Medications    Prior to Admission medications   Medication Sig Start Date End Date Taking? Authorizing Provider  aspirin 325 MG tablet Take 325 mg by mouth daily.    [provider]  folic acid (FOLVITE) 1 MG tablet Take 1 tablet by mouth daily. 04/06/17   [provider]  hydrochlorothiazide (HYDRODIURIL) 25 MG tablet Take 1 tablet by mouth daily. 09/22/17   [provider]  lisinopril (PRINIVIL,ZESTRIL) 20 MG tablet Take 1 tablet by mouth daily. 09/22/17   [provider]  metFORMIN (GLUCOPHAGE-XR) 500 MG 24 hr tablet Take 500 mg by mouth daily with breakfast.  04/01/17   [provider]  metoprolol succinate (TOPROL-XL) 50 MG 24 hr tablet Take 1 tablet by mouth 2 (two) times daily. 07/06/17   [provider]  Omega-3 Fatty Acids (FISH OIL) 1000 MG CAPS Take 1 capsule by mouth daily.    [provider]  simvastatin (ZOCOR) 80  MG tablet Take 1 tablet by mouth daily. 09/22/17   [provider]    Family History Family History  Problem Relation Age of Onset  . Diabetes Mother   . Diabetes Sister   . Diabetes Brother     Social History Social History   Tobacco Use  . Smoking status: Former Smoker    Last attempt to quit: 12/21/2005    Years since quitting: 12.6  . Smokeless tobacco: Never Used  Substance Use Topics  . Alcohol use: No  . Drug use: No     Allergies   Ciprofloxacin   Review of Systems Review of Systems  Constitutional: Positive for fever. Negative for appetite change and chills.  HENT: Negative for congestion, sore throat and trouble swallowing.   Respiratory: Positive for cough. Negative for chest tightness, shortness of breath and wheezing.   Cardiovascular: Negative for chest pain.  Gastrointestinal: Negative for abdominal pain, nausea and vomiting.  Genitourinary: Negative for dysuria.  Musculoskeletal: Negative for arthralgias.  Skin: Negative for rash.  Neurological: Negative for dizziness, weakness and numbness.  Hematological: Negative for adenopathy.     Physical Exam Updated Vital Signs BP 131/62   Pulse 66   Temp 98.6 F (37 C) (Oral)   Resp 18   Ht 6' (1.829 m)   Wt 111.1 kg   SpO2 97%  BMI 33.23 kg/m   Physical Exam  Constitutional: He appears well-developed and well-nourished. No distress.  HENT:  Head: Normocephalic and atraumatic.  Right Ear: Tympanic membrane and ear canal normal.  Left Ear: Tympanic membrane and ear canal normal.  Mouth/Throat: Uvula is midline, oropharynx is clear and moist and mucous membranes are normal. No oropharyngeal exudate.  Eyes: Pupils are equal, round, and reactive to light. EOM are normal.  Neck: Normal range of motion, full passive range of motion without pain and phonation normal. Neck supple.  Cardiovascular: Normal rate, regular rhythm, normal heart sounds and intact distal pulses.  No murmur  heard. Pulmonary/Chest: Effort normal. No stridor. No respiratory distress. He has no rales. He exhibits no tenderness.  Coarse lungs sounds bilaterally.  Few scattered expiratory wheezes.    Abdominal: Soft. He exhibits no distension. There is no tenderness.  Musculoskeletal: Normal range of motion. He exhibits no edema.  Lymphadenopathy:    He has no cervical adenopathy.  Neurological: He is alert. No sensory deficit.  Skin: Skin is warm and dry. Capillary refill takes less than 2 seconds.  Nursing note and vitals reviewed.    ED Treatments / Results  Labs (all labs ordered are listed, but only abnormal results are displayed) Labs Reviewed  CBC WITH DIFFERENTIAL/PLATELET - Abnormal; Notable for the following components:      Result Value   RBC 3.81 (*)    Hemoglobin 12.1 (*)    HCT 35.5 (*)    All other components within normal limits  BASIC METABOLIC PANEL - Abnormal; Notable for the following components:   BUN 41 (*)    Creatinine, Ser 1.68 (*)    Calcium 8.6 (*)    GFR calc non Af Amer 38 (*)    GFR calc Af Amer 44 (*)    All other components within normal limits  TROPONIN I  BRAIN NATRIURETIC PEPTIDE    EKG EKG Interpretation  Date/Time:  Friday August 26 2018 10:58:17 EDT Ventricular Rate:  65 PR Interval:    QRS Duration: 102 QT Interval:  428 QTC Calculation: 445 R Axis:   71 Text Interpretation:  Sinus rhythm When compared with ECG of 09/23/2017 No significant change was found Confirmed by Francine Graven [phone]) on 08/26/2018 12:16:05 PM   Radiology Dg Chest 2 View  Result Date: 08/26/2018 CLINICAL DATA:  PRODUCTIVE COUGH SINCE 08-09-18, NOT GETTING BETTER AFTER BEING ON ANTIBIOTICS X 3 DAYS, HTN, FORMER SMOKER SINCE 2002 EXAM: CHEST - 2 VIEW COMPARISON:  Chest x-ray dated 09/23/2017. FINDINGS: Heart size is upper normal, stable. Lungs are clear. No pleural effusion or pneumothorax seen. No acute or suspicious osseous finding. IMPRESSION: No active  cardiopulmonary disease. No evidence of pneumonia or pulmonary edema. Electronically Signed   By: Franki Cabot M.D.   On: 08/26/2018 10:44    Procedures Procedures (including critical care time)  Medications Ordered in ED Medications  ipratropium-albuterol (DUONEB) 0.5-2.5 (3) MG/3ML nebulizer solution 3 mL (3 mLs Nebulization Given 08/26/18 1111)  albuterol (PROVENTIL) (2.5 MG/3ML) 0.083% nebulizer solution 2.5 mg (2.5 mg Nebulization Given 08/26/18 1111)     Initial Impression / Assessment and Plan / ED Course  I have reviewed the triage vital signs and the nursing notes.  Pertinent labs & imaging results that were available during my care of the patient were reviewed by me and considered in my medical decision making (see chart for details).     Pt with productive cough for 2 weeks, currently taking abx prescribed by  PCP.  Work up here reassuring. Lung sounds improved after albuterol neb.  No tachycardia or hypoxia.  Pt agrees to care plan, appears appropriate for d/c.  Return precautions discussed.     Final Clinical Impressions(s) / ED Diagnoses   Final diagnoses:  Bronchitis    ED Discharge Orders    None       Kem Parkinson, PA-C 08/28/18 2042    Francine Graven, DO 08/30/18 [phone]

## 2018-08-26 NOTE — ED Notes (Signed)
RT notified for neb tx. 

## 2019-02-01 IMAGING — DX DG CHEST 2V
2 series · 2 of 2 positions shown · non-contrast
Comparison: 05/19/2017

CLINICAL DATA: Chest pain

EXAM:
CHEST  2 VIEW

[chest lat]
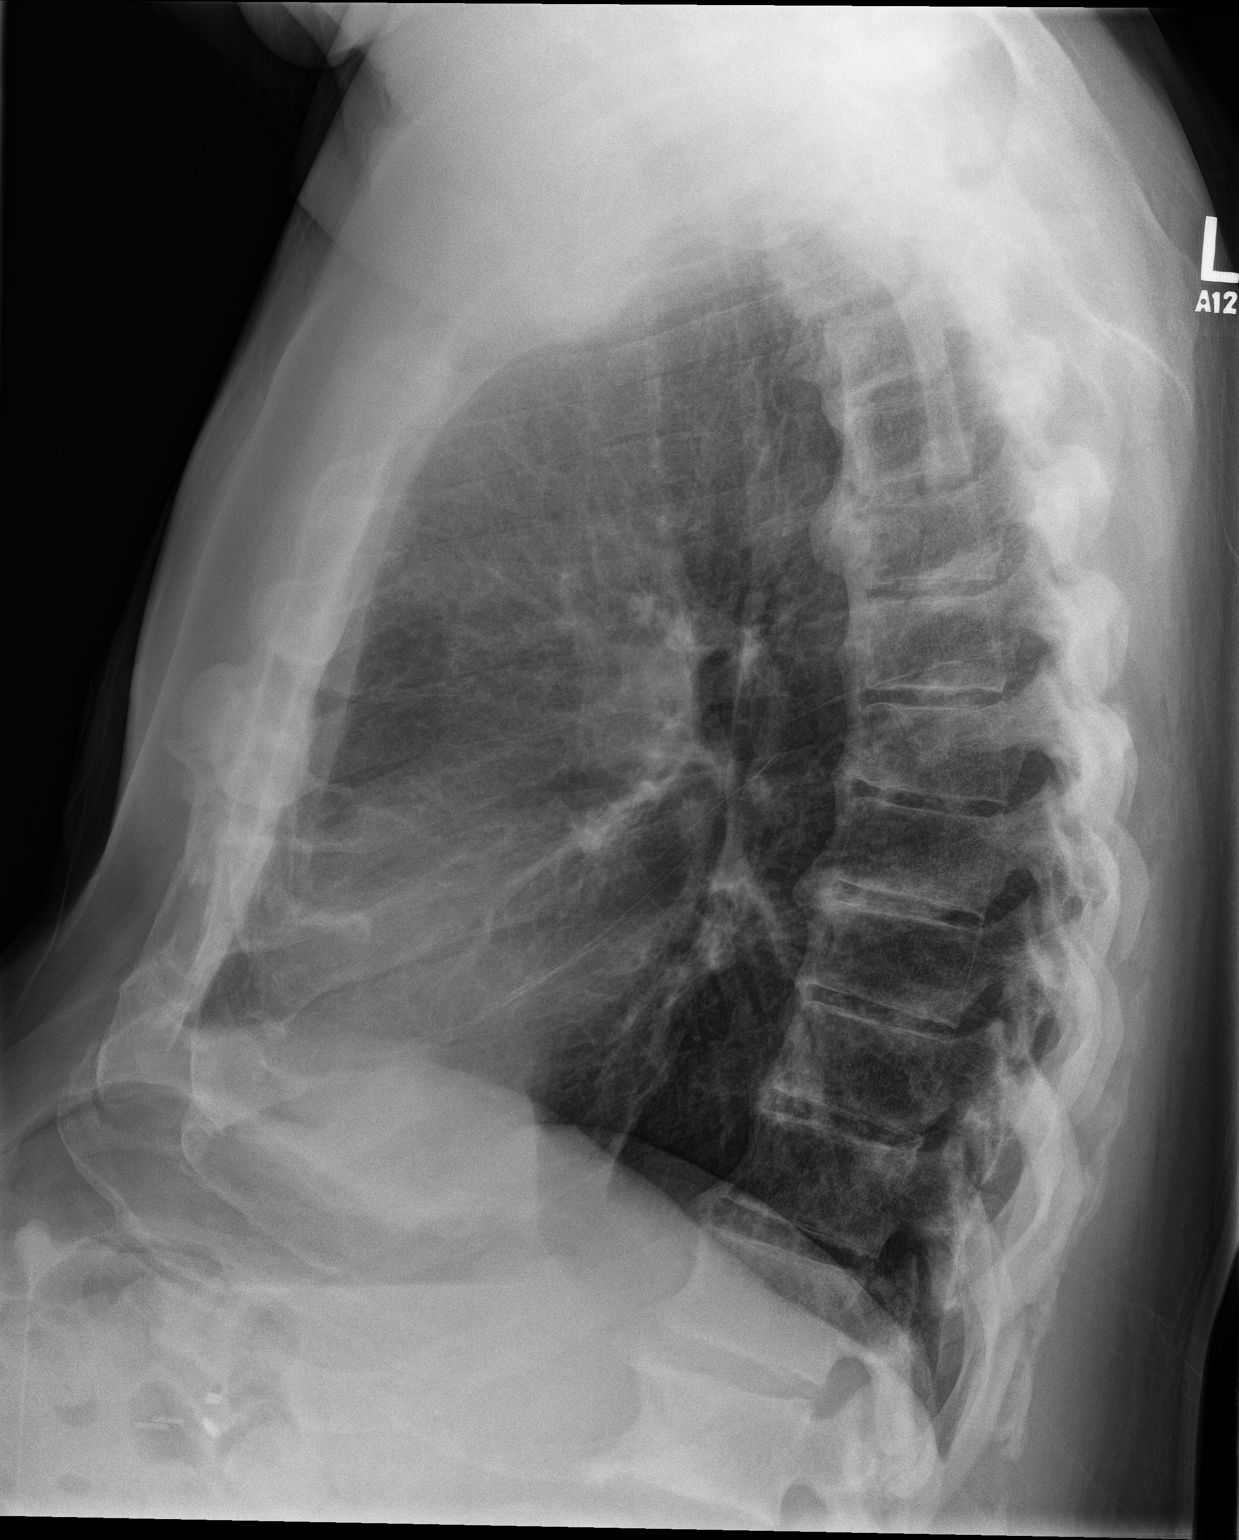

[chest pa]
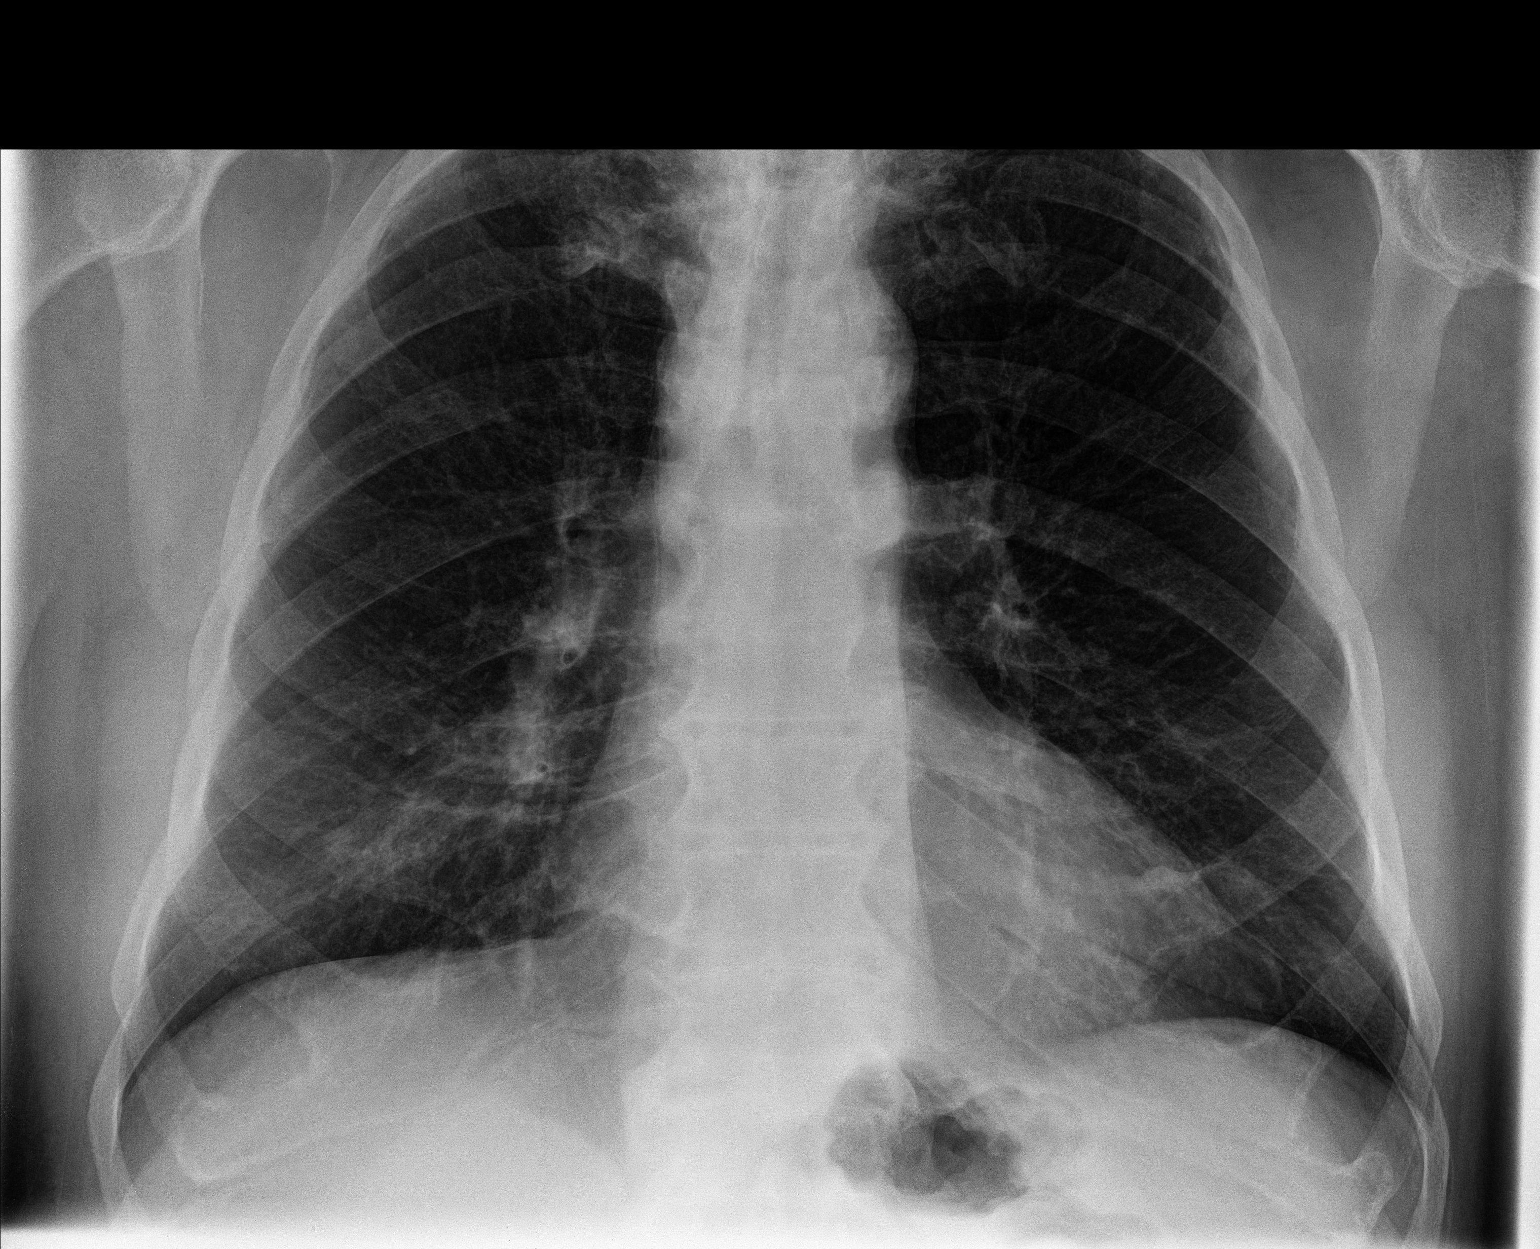

[2 of 2 positions shown; findings below may reference images not displayed]

FINDINGS: Cardiac shadow is stable. Degenerative changes of thoracic spine are
noted. The lungs are well aerated bilaterally. No focal infiltrate
or sizable effusion is seen.
IMPRESSION: No acute abnormality noted.

## 2019-09-27 DIAGNOSIS — E785 Hyperlipidemia, unspecified: Secondary | ICD-10-CM | POA: Diagnosis not present

## 2019-09-27 DIAGNOSIS — I10 Essential (primary) hypertension: Secondary | ICD-10-CM | POA: Diagnosis not present

## 2019-09-27 DIAGNOSIS — E119 Type 2 diabetes mellitus without complications: Secondary | ICD-10-CM | POA: Diagnosis not present

## 2019-09-27 DIAGNOSIS — Z23 Encounter for immunization: Secondary | ICD-10-CM | POA: Diagnosis not present

## 2019-10-10 DIAGNOSIS — E785 Hyperlipidemia, unspecified: Secondary | ICD-10-CM | POA: Diagnosis not present

## 2019-10-10 DIAGNOSIS — I10 Essential (primary) hypertension: Secondary | ICD-10-CM | POA: Diagnosis not present

## 2019-10-10 DIAGNOSIS — E119 Type 2 diabetes mellitus without complications: Secondary | ICD-10-CM | POA: Diagnosis not present

## 2020-01-04 IMAGING — DX DG CHEST 2V
2 series · 2 of 2 positions shown · non-contrast
Comparison: Chest x-ray dated 09/23/2017.

CLINICAL DATA: PRODUCTIVE COUGH SINCE 08-09-18, NOT GETTING BETTER
AFTER BEING ON ANTIBIOTICS X 3 DAYS, HTN, FORMER SMOKER SINCE 9779

EXAM:
CHEST - 2 VIEW

[chest pa]
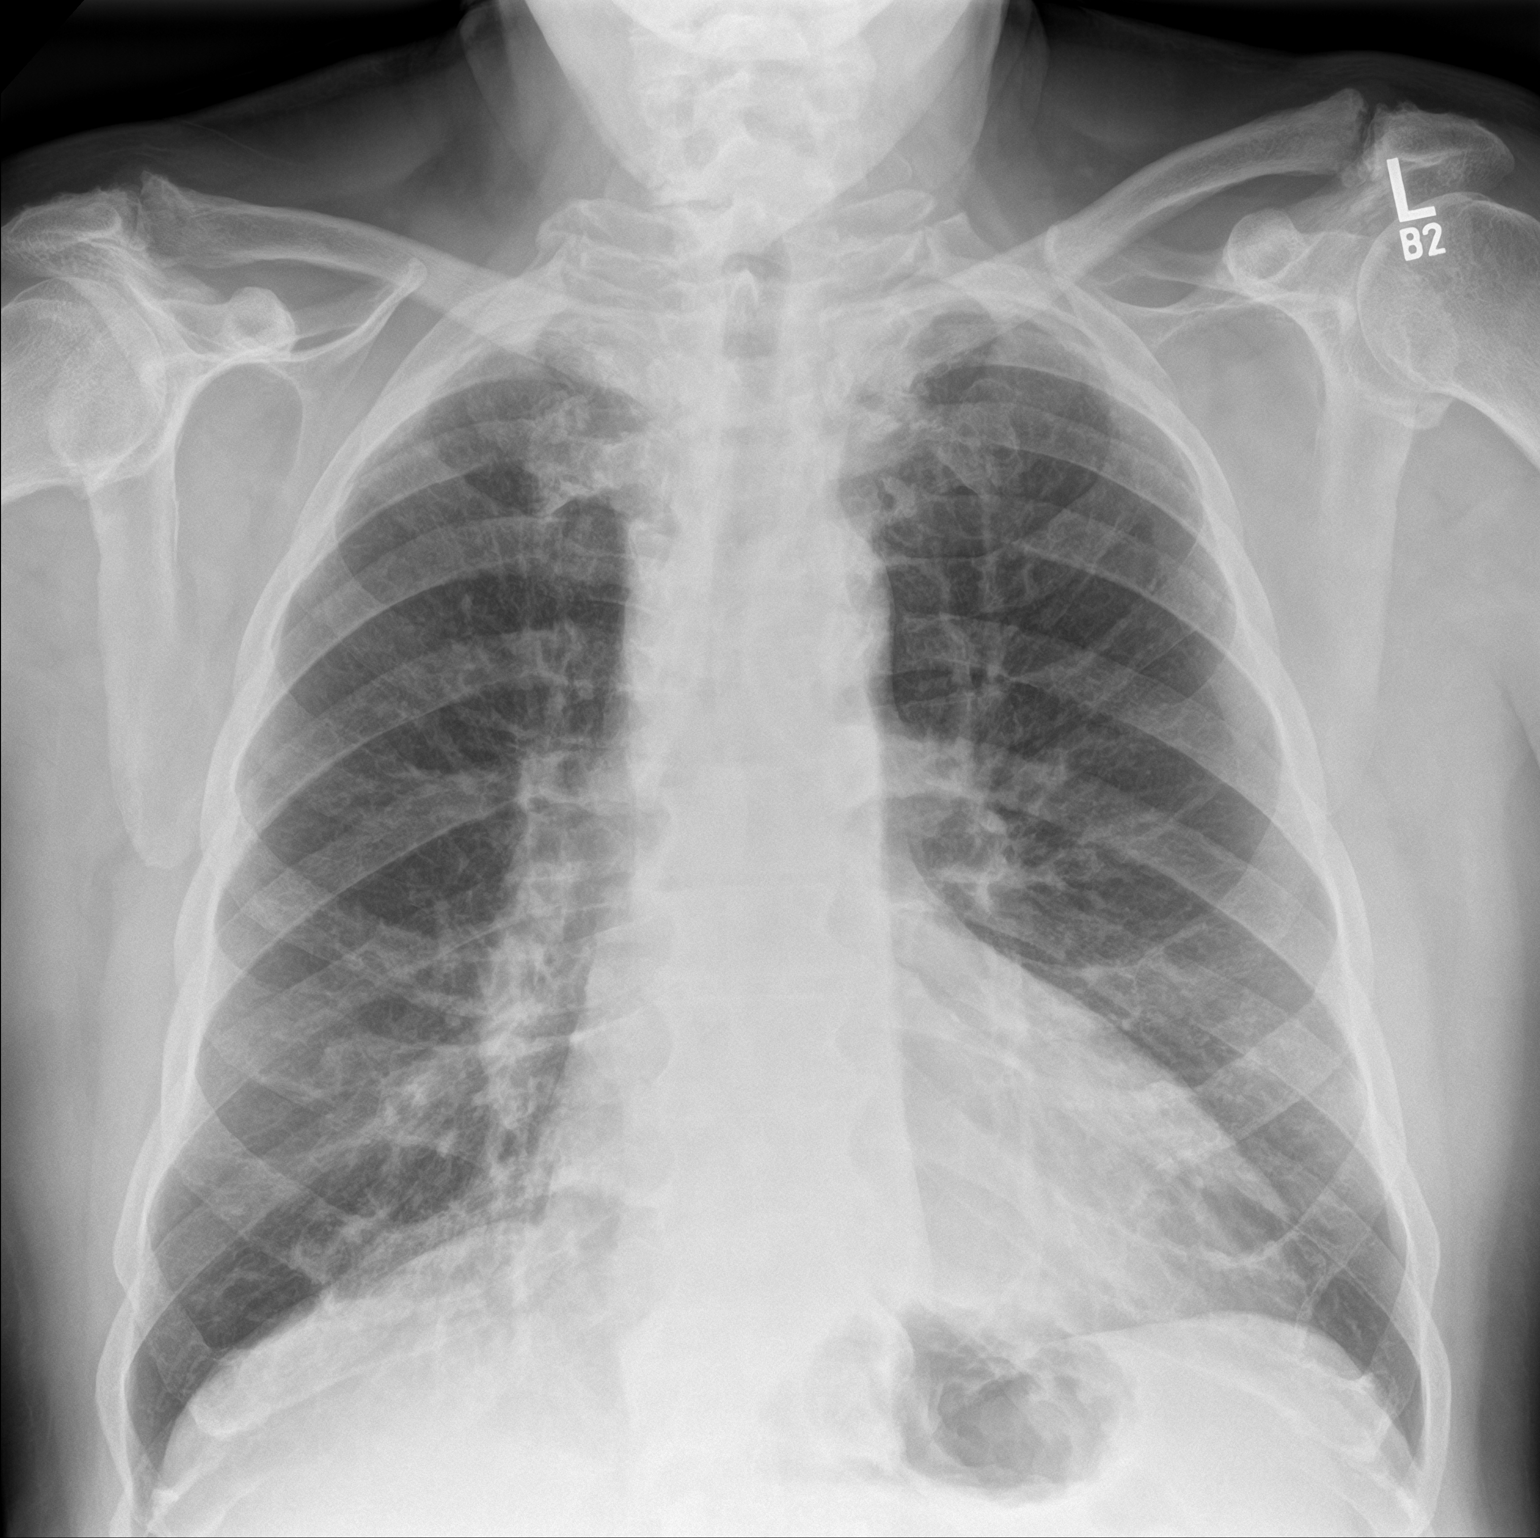

[chest lat]
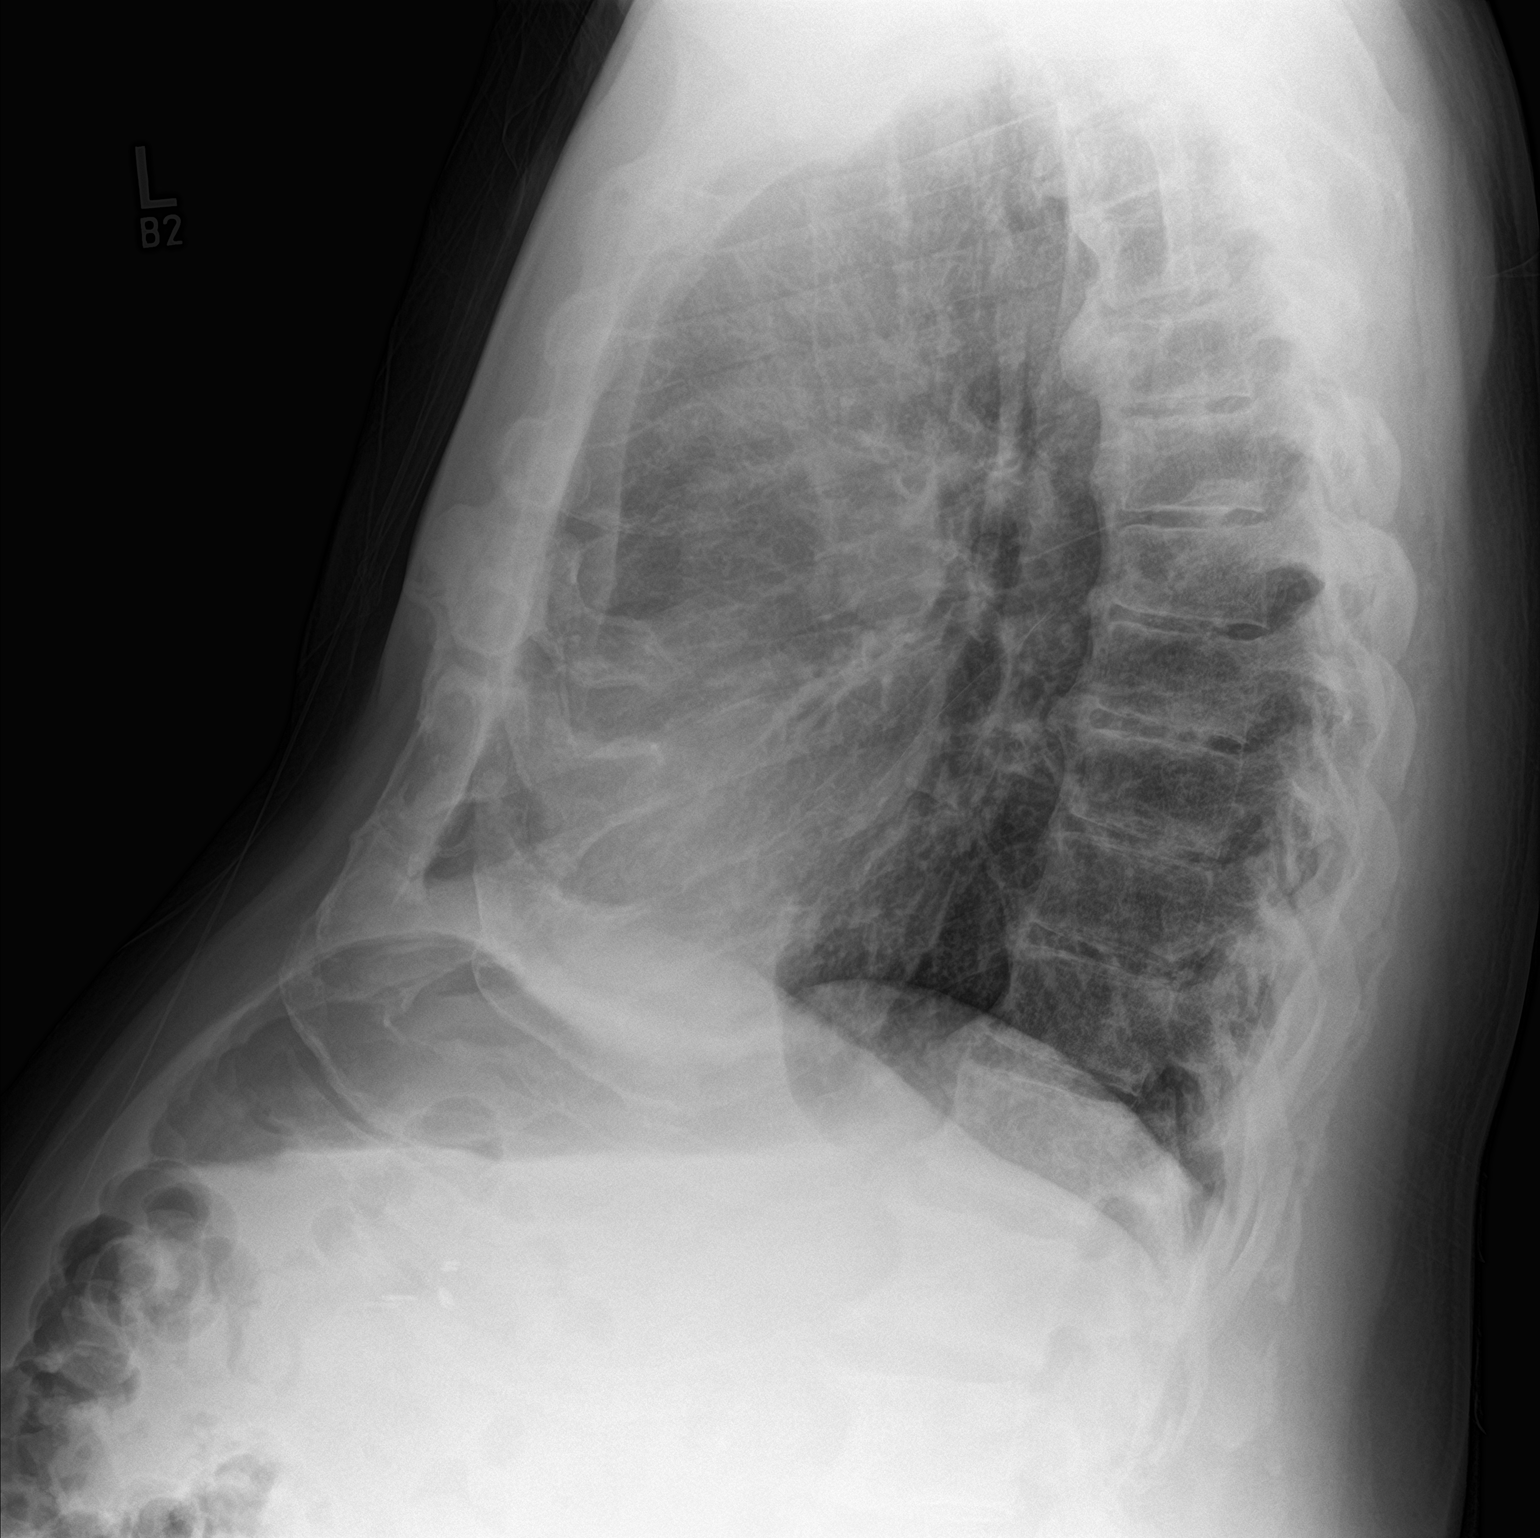

[2 of 2 positions shown; findings below may reference images not displayed]

FINDINGS: Heart size is upper normal, stable. Lungs are clear. No pleural
effusion or pneumothorax seen. No acute or suspicious osseous
finding.
IMPRESSION: No active cardiopulmonary disease. No evidence of pneumonia or
pulmonary edema.

## 2020-01-16 DIAGNOSIS — E119 Type 2 diabetes mellitus without complications: Secondary | ICD-10-CM | POA: Diagnosis not present

## 2020-01-16 DIAGNOSIS — I10 Essential (primary) hypertension: Secondary | ICD-10-CM | POA: Diagnosis not present

## 2020-01-18 DIAGNOSIS — E119 Type 2 diabetes mellitus without complications: Secondary | ICD-10-CM | POA: Diagnosis not present

## 2020-01-18 DIAGNOSIS — I10 Essential (primary) hypertension: Secondary | ICD-10-CM | POA: Diagnosis not present

## 2020-05-28 DIAGNOSIS — I10 Essential (primary) hypertension: Secondary | ICD-10-CM | POA: Diagnosis not present

## 2020-05-28 DIAGNOSIS — E119 Type 2 diabetes mellitus without complications: Secondary | ICD-10-CM | POA: Diagnosis not present

## 2020-05-28 DIAGNOSIS — E785 Hyperlipidemia, unspecified: Secondary | ICD-10-CM | POA: Diagnosis not present

## 2020-10-08 DIAGNOSIS — I10 Essential (primary) hypertension: Secondary | ICD-10-CM | POA: Diagnosis not present

## 2020-10-08 DIAGNOSIS — E119 Type 2 diabetes mellitus without complications: Secondary | ICD-10-CM | POA: Diagnosis not present

## 2020-10-08 DIAGNOSIS — E785 Hyperlipidemia, unspecified: Secondary | ICD-10-CM | POA: Diagnosis not present

## 2021-03-04 DIAGNOSIS — E119 Type 2 diabetes mellitus without complications: Secondary | ICD-10-CM | POA: Diagnosis not present

## 2021-03-04 DIAGNOSIS — E785 Hyperlipidemia, unspecified: Secondary | ICD-10-CM | POA: Diagnosis not present

## 2021-03-04 DIAGNOSIS — I10 Essential (primary) hypertension: Secondary | ICD-10-CM | POA: Diagnosis not present

## 2021-03-10 DIAGNOSIS — E119 Type 2 diabetes mellitus without complications: Secondary | ICD-10-CM | POA: Diagnosis not present

## 2021-03-10 DIAGNOSIS — E785 Hyperlipidemia, unspecified: Secondary | ICD-10-CM | POA: Diagnosis not present

## 2021-03-10 DIAGNOSIS — N189 Chronic kidney disease, unspecified: Secondary | ICD-10-CM | POA: Diagnosis not present

## 2021-03-10 DIAGNOSIS — I10 Essential (primary) hypertension: Secondary | ICD-10-CM | POA: Diagnosis not present

## 2021-06-03 DIAGNOSIS — I10 Essential (primary) hypertension: Secondary | ICD-10-CM | POA: Diagnosis not present

## 2021-06-03 DIAGNOSIS — E119 Type 2 diabetes mellitus without complications: Secondary | ICD-10-CM | POA: Diagnosis not present

## 2021-06-10 DIAGNOSIS — I10 Essential (primary) hypertension: Secondary | ICD-10-CM | POA: Diagnosis not present

## 2021-06-10 DIAGNOSIS — E119 Type 2 diabetes mellitus without complications: Secondary | ICD-10-CM | POA: Diagnosis not present

## 2021-06-10 DIAGNOSIS — E785 Hyperlipidemia, unspecified: Secondary | ICD-10-CM | POA: Diagnosis not present

## 2021-09-02 DIAGNOSIS — I10 Essential (primary) hypertension: Secondary | ICD-10-CM | POA: Diagnosis not present

## 2021-09-02 DIAGNOSIS — E119 Type 2 diabetes mellitus without complications: Secondary | ICD-10-CM | POA: Diagnosis not present

## 2021-09-02 DIAGNOSIS — E785 Hyperlipidemia, unspecified: Secondary | ICD-10-CM | POA: Diagnosis not present

## 2021-12-30 DIAGNOSIS — E785 Hyperlipidemia, unspecified: Secondary | ICD-10-CM | POA: Diagnosis not present

## 2021-12-30 DIAGNOSIS — I10 Essential (primary) hypertension: Secondary | ICD-10-CM | POA: Diagnosis not present

## 2021-12-30 DIAGNOSIS — E119 Type 2 diabetes mellitus without complications: Secondary | ICD-10-CM | POA: Diagnosis not present

## 2021-12-31 DIAGNOSIS — E119 Type 2 diabetes mellitus without complications: Secondary | ICD-10-CM | POA: Diagnosis not present

## 2021-12-31 DIAGNOSIS — I10 Essential (primary) hypertension: Secondary | ICD-10-CM | POA: Diagnosis not present

## 2021-12-31 DIAGNOSIS — E785 Hyperlipidemia, unspecified: Secondary | ICD-10-CM | POA: Diagnosis not present

## 2022-03-24 DIAGNOSIS — I10 Essential (primary) hypertension: Secondary | ICD-10-CM | POA: Diagnosis not present

## 2022-03-24 DIAGNOSIS — E119 Type 2 diabetes mellitus without complications: Secondary | ICD-10-CM | POA: Diagnosis not present

## 2022-03-31 DIAGNOSIS — I10 Essential (primary) hypertension: Secondary | ICD-10-CM | POA: Diagnosis not present

## 2022-03-31 DIAGNOSIS — E119 Type 2 diabetes mellitus without complications: Secondary | ICD-10-CM | POA: Diagnosis not present

## 2022-08-07 DIAGNOSIS — M5440 Lumbago with sciatica, unspecified side: Secondary | ICD-10-CM | POA: Diagnosis not present

## 2022-08-10 ENCOUNTER — Other Ambulatory Visit: Payer: Self-pay | Admitting: Internal Medicine

## 2022-08-10 DIAGNOSIS — M5440 Lumbago with sciatica, unspecified side: Secondary | ICD-10-CM

## 2022-08-13 ENCOUNTER — Ambulatory Visit
Admission: RE | Admit: 2022-08-13 | Discharge: 2022-08-13 | Disposition: A | Discharge status: Home / Self Care | Payer: Medicare Other | Source: Ambulatory Visit | Attending: Internal Medicine | Admitting: Internal Medicine

## 2022-08-13 DIAGNOSIS — M961 Postlaminectomy syndrome, not elsewhere classified: Secondary | ICD-10-CM | POA: Diagnosis not present

## 2022-08-13 DIAGNOSIS — R531 Weakness: Secondary | ICD-10-CM | POA: Diagnosis not present

## 2022-08-13 DIAGNOSIS — M5440 Lumbago with sciatica, unspecified side: Secondary | ICD-10-CM

## 2022-08-13 DIAGNOSIS — M5126 Other intervertebral disc displacement, lumbar region: Secondary | ICD-10-CM | POA: Diagnosis not present

## 2022-08-13 DIAGNOSIS — M2578 Osteophyte, vertebrae: Secondary | ICD-10-CM | POA: Diagnosis not present

## 2022-08-13 MED ORDER — GADOBENATE DIMEGLUMINE 529 MG/ML IV SOLN
20.0000 mL | Freq: Once | INTRAVENOUS | Status: AC | PRN
  Administered 2022-08-13: 20 mL via INTRAVENOUS

## 2022-08-16 ENCOUNTER — Other Ambulatory Visit: Payer: Medicare Other

## 2022-08-17 DIAGNOSIS — M48062 Spinal stenosis, lumbar region with neurogenic claudication: Secondary | ICD-10-CM | POA: Diagnosis not present

## 2022-08-27 ENCOUNTER — Other Ambulatory Visit: Payer: Self-pay | Admitting: Neurosurgery

## 2022-09-01 NOTE — Pre-Procedure Instructions (Signed)
Surgical Instructions    Your procedure is scheduled on September 04, 2022.  Report to [facility] Main Entrance "A" at 11:00 A.M., then check in with the Admitting office.  Call this number if you have problems the morning of surgery:  [phone]   If you have any questions prior to your surgery date call [phone]: Open Monday-Friday 8am-4pm    Remember:  Do not eat after midnight the night before your surgery  You may drink clear liquids until 10:00 AM the morning of your surgery.   Clear liquids allowed are: Water, Non-Citrus Juices (without pulp), Carbonated Beverages, Clear Tea, Black Coffee Only (NO MILK, CREAM OR POWDERED CREAMER of any kind), and Gatorade.     Take these medicines the morning of surgery with A SIP OF WATER:  metoprolol succinate (TOPROL-XL)   Follow your surgeon's instructions on when to stop Aspirin.  If no instructions were given by your surgeon then you will need to call the office to get those instructions.     As of today, STOP taking any Aleve, Naproxen, Ibuprofen, Motrin, Advil, Goody's, BC's, all herbal medications, fish oil, and all vitamins.                     Do NOT Smoke (Tobacco/Vaping) for 24 hours prior to your procedure.  If you use a CPAP at night, you may bring your mask/headgear for your overnight stay.   Contacts, glasses, piercing's, hearing aid's, dentures or partials may not be worn into surgery, please bring cases for these belongings.    For patients admitted to the hospital, discharge time will be determined by your treatment team.   Patients discharged the day of surgery will not be allowed to drive home, and someone needs to stay with them for 24 hours.  SURGICAL WAITING ROOM VISITATION Patients having surgery or a procedure may have no more than 2 support people in the waiting area - these visitors may rotate.   Children under the age of 93 must have an adult with them who is not the patient. If the patient needs to  stay at the hospital during part of their recovery, the visitor guidelines for inpatient rooms apply. Pre-op nurse will coordinate an appropriate time for 1 support person to accompany patient in pre-op.  This support person may not rotate.   Please refer to the [facility] website for the visitor guidelines for Inpatients (after your surgery is over and you are in a regular room).    Special instructions:   [place]- Preparing For Surgery  Before surgery, you can play an important role. Because skin is not sterile, your skin needs to be as free of germs as possible. You can reduce the number of germs on your skin by washing with CHG (chlorahexidine gluconate) Soap before surgery.  CHG is an antiseptic cleaner which kills germs and bonds with the skin to continue killing germs even after washing.    Oral Hygiene is also important to reduce your risk of infection.  Remember - BRUSH YOUR TEETH THE MORNING OF SURGERY WITH YOUR REGULAR TOOTHPASTE  Please do not use if you have an allergy to CHG or antibacterial soaps. If your skin becomes reddened/irritated stop using the CHG.  Do not shave (including legs and underarms) for at least 48 hours prior to first CHG shower. It is OK to shave your face.  Please follow these instructions carefully.   Shower the NIGHT BEFORE SURGERY and the MORNING OF SURGERY  If you chose to wash your hair, wash your hair first as usual with your normal shampoo.  After you shampoo, rinse your hair and body thoroughly to remove the shampoo.  Use CHG Soap as you would any other liquid soap. You can apply CHG directly to the skin and wash gently with a scrungie or a clean washcloth.   Apply the CHG Soap to your body ONLY FROM THE NECK DOWN.  Do not use on open wounds or open sores. Avoid contact with your eyes, ears, mouth and genitals (private parts). Wash Face and genitals (private parts)  with your normal soap.   Wash thoroughly, paying special attention to the  area where your surgery will be performed.  Thoroughly rinse your body with warm water from the neck down.  DO NOT shower/wash with your normal soap after using and rinsing off the CHG Soap.  Pat yourself dry with a CLEAN TOWEL.  Wear CLEAN PAJAMAS to bed the night before surgery  Place CLEAN SHEETS on your bed the night before your surgery  DO NOT SLEEP WITH PETS.   Day of Surgery: Take a shower with CHG soap. Do not wear jewelry or makeup Do not wear lotions, powders, colognes, or deodorant. Do not shave 48 hours prior to surgery.  Men may shave face and neck. Do not bring valuables to the hospital.  [facility] is not responsible for any belongings or valuables. Do not wear nail polish, gel polish, artificial nails, or any other type of covering on natural nails (fingers and toes) If you have artificial nails or gel coating that need to be removed by a nail salon, please have this removed prior to surgery. Artificial nails or gel coating may interfere with anesthesia's ability to adequately monitor your vital signs.  Wear Clean/Comfortable clothing the morning of surgery Remember to brush your teeth WITH YOUR REGULAR TOOTHPASTE.   Please read over the following fact sheets that you were given.    If you received a COVID test during your pre-op visit  it is requested that you wear a mask when out in public, stay away from anyone that may not be feeling well and notify your surgeon if you develop symptoms. If you have been in contact with anyone that has tested positive in the last 10 days please notify you surgeon.

## 2022-09-02 ENCOUNTER — Encounter (HOSPITAL_COMMUNITY): Payer: Self-pay

## 2022-09-02 ENCOUNTER — Encounter (HOSPITAL_COMMUNITY)
Admission: RE | Admit: 2022-09-02 | Discharge: 2022-09-02 | Disposition: A | Discharge status: Home / Self Care | Payer: Medicare Other | Source: Ambulatory Visit | Attending: Neurosurgery | Admitting: Neurosurgery

## 2022-09-02 ENCOUNTER — Other Ambulatory Visit: Payer: Self-pay

## 2022-09-02 VITALS — BP 168/63 | HR 65 | Temp 97.7°F | Resp 18 | Ht 72.0 in | Wt 225.9 lb

## 2022-09-02 DIAGNOSIS — Z01818 Encounter for other preprocedural examination: Secondary | ICD-10-CM | POA: Diagnosis not present

## 2022-09-02 DIAGNOSIS — I10 Essential (primary) hypertension: Secondary | ICD-10-CM | POA: Diagnosis not present

## 2022-09-02 HISTORY — DX: Pneumonia, unspecified organism: J18.9

## 2022-09-02 HISTORY — DX: Unspecified fracture of shaft of humerus, right arm, initial encounter for closed fracture: S42.301A

## 2022-09-02 HISTORY — DX: Malignant (primary) neoplasm, unspecified: C80.1

## 2022-09-02 LAB — SURGICAL PCR SCREEN
MRSA, PCR: NEGATIVE
Staphylococcus aureus: POSITIVE — AB

## 2022-09-02 LAB — CBC
HCT: 39 % (ref 39.0–52.0)
Hemoglobin: 13.5 g/dL (ref 13.0–17.0)
MCH: 31.8 pg (ref 26.0–34.0)
MCHC: 34.6 g/dL (ref 30.0–36.0)
MCV: 92 fL (ref 80.0–100.0)
Platelets: 231 10*3/uL (ref 150–400)
RBC: 4.24 MIL/uL (ref 4.22–5.81)
RDW: 13.1 % (ref 11.5–15.5)
WBC: 6 10*3/uL (ref 4.0–10.5)
nRBC: 0 % (ref 0.0–0.2)

## 2022-09-02 LAB — BASIC METABOLIC PANEL
Anion gap: 8 (ref 5–15)
BUN: 31 mg/dL — ABNORMAL HIGH (ref 8–23)
CO2: 27 mmol/L (ref 22–32)
Calcium: 9.4 mg/dL (ref 8.9–10.3)
Chloride: 103 mmol/L (ref 98–111)
Creatinine, Ser: 1.65 mg/dL — ABNORMAL HIGH (ref 0.61–1.24)
GFR, Estimated: 42 mL/min — ABNORMAL LOW (ref >60)
Glucose, Bld: 116 mg/dL — ABNORMAL HIGH (ref 70–99)
Potassium: 4.4 mmol/L (ref 3.5–5.1)
Sodium: 138 mmol/L (ref 135–145)

## 2022-09-02 NOTE — Progress Notes (Signed)
PCP - Haynes Kerns with [facility] in [place] - Denies  PPM/ICD - Denies  Chest x-ray - 08/26/18 EKG - 09/02/22 Stress Test - No ECHO - No Cardiac Cath - No  Sleep Study - No  DM - Borderline  Blood Thinner Instructions: Denies Aspirin Instructions: Per patient instructed to stop on Wednesday the 6th of September  ERAS Protcol -Yes  COVID TEST- NI   Anesthesia review: No  Patient denies shortness of breath, fever, cough and chest pain at PAT appointment   All instructions explained to the patient, with a verbal understanding of the material. Patient agrees to go over the instructions while at home for a better understanding.  The opportunity to ask questions was provided.

## 2022-09-04 ENCOUNTER — Encounter (HOSPITAL_COMMUNITY): Payer: Self-pay | Admitting: Neurosurgery

## 2022-09-04 ENCOUNTER — Other Ambulatory Visit: Payer: Self-pay

## 2022-09-04 ENCOUNTER — Ambulatory Visit (HOSPITAL_BASED_OUTPATIENT_CLINIC_OR_DEPARTMENT_OTHER): Payer: Medicare Other | Admitting: Anesthesiology

## 2022-09-04 ENCOUNTER — Ambulatory Visit (HOSPITAL_COMMUNITY): Payer: Medicare Other | Admitting: Anesthesiology

## 2022-09-04 ENCOUNTER — Observation Stay (HOSPITAL_COMMUNITY)
Admission: RE | Admit: 2022-09-04 | Discharge: 2022-09-05 | Disposition: A | Discharge status: Home / Self Care | Payer: Medicare Other | Source: Ambulatory Visit | Attending: Neurosurgery | Admitting: Neurosurgery

## 2022-09-04 ENCOUNTER — Encounter (HOSPITAL_COMMUNITY)
Admission: RE | Disposition: A | Discharge status: Home / Self Care | Payer: Self-pay | Source: Ambulatory Visit | Attending: Neurosurgery

## 2022-09-04 ENCOUNTER — Ambulatory Visit (HOSPITAL_COMMUNITY): Payer: Medicare Other

## 2022-09-04 DIAGNOSIS — E119 Type 2 diabetes mellitus without complications: Secondary | ICD-10-CM

## 2022-09-04 DIAGNOSIS — M48062 Spinal stenosis, lumbar region with neurogenic claudication: Secondary | ICD-10-CM

## 2022-09-04 DIAGNOSIS — I10 Essential (primary) hypertension: Secondary | ICD-10-CM | POA: Diagnosis not present

## 2022-09-04 DIAGNOSIS — Z87891 Personal history of nicotine dependence: Secondary | ICD-10-CM | POA: Diagnosis not present

## 2022-09-04 DIAGNOSIS — R7309 Other abnormal glucose: Secondary | ICD-10-CM | POA: Diagnosis not present

## 2022-09-04 DIAGNOSIS — M2578 Osteophyte, vertebrae: Secondary | ICD-10-CM | POA: Diagnosis not present

## 2022-09-04 HISTORY — PX: LUMBAR LAMINECTOMY/DECOMPRESSION MICRODISCECTOMY: SHX5026

## 2022-09-04 LAB — GLUCOSE, CAPILLARY
Glucose-Capillary: 112 mg/dL — ABNORMAL HIGH (ref 70–99)
Glucose-Capillary: 98 mg/dL (ref 70–99)
Glucose-Capillary: 236 mg/dL — ABNORMAL HIGH (ref 70–99)

## 2022-09-04 SURGERY — LUMBAR LAMINECTOMY/DECOMPRESSION MICRODISCECTOMY 1 LEVEL
Anesthesia: General | Site: Spine Lumbar

## 2022-09-04 MED ORDER — ONDANSETRON HCL 4 MG/2ML IJ SOLN: 4.0000 mg | Freq: Once | INTRAMUSCULAR | Status: DC | PRN

## 2022-09-04 MED ORDER — BUPIVACAINE HCL (PF) 0.5 % IJ SOLN
INTRAMUSCULAR | Status: AC
  Filled 2022-09-04: qty 30

## 2022-09-04 MED ORDER — THROMBIN 5000 UNITS EX SOLR
CUTANEOUS | Status: AC
  Filled 2022-09-04: qty 10000

## 2022-09-04 MED ORDER — FENTANYL CITRATE (PF) 250 MCG/5ML IJ SOLN
INTRAMUSCULAR | Status: DC | PRN
  Administered 2022-09-04: 100 ug via INTRAVENOUS

## 2022-09-04 MED ORDER — LOSARTAN POTASSIUM 50 MG PO TABS: 100.0000 mg | ORAL_TABLET | Freq: Every day | ORAL | Status: DC

## 2022-09-04 MED ORDER — CEFAZOLIN SODIUM-DEXTROSE 2-4 GM/100ML-% IV SOLN
2.0000 g | INTRAVENOUS | Status: AC
  Administered 2022-09-04: 2 g via INTRAVENOUS
  Filled 2022-09-04: qty 100

## 2022-09-04 MED ORDER — ACETAMINOPHEN 500 MG PO TABS
1000.0000 mg | ORAL_TABLET | Freq: Once | ORAL | Status: AC
  Administered 2022-09-04: 1000 mg via ORAL
  Filled 2022-09-04: qty 2

## 2022-09-04 MED ORDER — LOSARTAN POTASSIUM 50 MG PO TABS
100.0000 mg | ORAL_TABLET | Freq: Every day | ORAL | Status: DC
  Administered 2022-09-04: 100 mg via ORAL
  Filled 2022-09-04: qty 2

## 2022-09-04 MED ORDER — ACETAMINOPHEN 325 MG PO TABS: 650.0000 mg | ORAL_TABLET | ORAL | Status: DC | PRN

## 2022-09-04 MED ORDER — OXYCODONE HCL 5 MG PO TABS: 5.0000 mg | ORAL_TABLET | ORAL | Status: DC | PRN

## 2022-09-04 MED ORDER — CO Q-10 50 MG PO CAPS: ORAL_CAPSULE | Freq: Every day | ORAL | Status: DC

## 2022-09-04 MED ORDER — SUGAMMADEX SODIUM 200 MG/2ML IV SOLN
INTRAVENOUS | Status: DC | PRN
  Administered 2022-09-04: 300 mg via INTRAVENOUS

## 2022-09-04 MED ORDER — EPHEDRINE SULFATE-NACL 50-0.9 MG/10ML-% IV SOSY
PREFILLED_SYRINGE | INTRAVENOUS | Status: DC | PRN
  Administered 2022-09-04: 5 mg via INTRAVENOUS

## 2022-09-04 MED ORDER — THROMBIN 5000 UNITS EX SOLR
CUTANEOUS | Status: DC | PRN
  Administered 2022-09-04 (×2): 5000 [IU] via TOPICAL

## 2022-09-04 MED ORDER — PHENYLEPHRINE 80 MCG/ML (10ML) SYRINGE FOR IV PUSH (FOR BLOOD PRESSURE SUPPORT)
PREFILLED_SYRINGE | INTRAVENOUS | Status: DC | PRN
  Administered 2022-09-04: 80 ug via INTRAVENOUS

## 2022-09-04 MED ORDER — OXYCODONE HCL 5 MG/5ML PO SOLN: 5.0000 mg | Freq: Once | ORAL | Status: DC | PRN

## 2022-09-04 MED ORDER — DEXAMETHASONE SODIUM PHOSPHATE 10 MG/ML IJ SOLN
INTRAMUSCULAR | Status: DC | PRN
  Administered 2022-09-04: 5 mg via INTRAVENOUS

## 2022-09-04 MED ORDER — BISACODYL 5 MG PO TBEC: 5.0000 mg | DELAYED_RELEASE_TABLET | Freq: Every day | ORAL | Status: DC | PRN

## 2022-09-04 MED ORDER — GLYCOPYRROLATE PF 0.2 MG/ML IJ SOSY
PREFILLED_SYRINGE | INTRAMUSCULAR | Status: DC | PRN
  Administered 2022-09-04: .1 mg via INTRAVENOUS

## 2022-09-04 MED ORDER — LIDOCAINE 2% (20 MG/ML) 5 ML SYRINGE
INTRAMUSCULAR | Status: DC | PRN
  Administered 2022-09-04: 100 mg via INTRAVENOUS

## 2022-09-04 MED ORDER — 0.9 % SODIUM CHLORIDE (POUR BTL) OPTIME
TOPICAL | Status: DC | PRN
  Administered 2022-09-04: 1000 mL

## 2022-09-04 MED ORDER — HEMOSTATIC AGENTS (NO CHARGE) OPTIME
TOPICAL | Status: DC | PRN
  Administered 2022-09-04: 1 via TOPICAL

## 2022-09-04 MED ORDER — PHENOL 1.4 % MT LIQD: 1.0000 | OROMUCOSAL | Status: DC | PRN

## 2022-09-04 MED ORDER — BUPIVACAINE HCL (PF) 0.5 % IJ SOLN
INTRAMUSCULAR | Status: DC | PRN
  Administered 2022-09-04: 30 mL

## 2022-09-04 MED ORDER — LIDOCAINE-EPINEPHRINE 0.5 %-1:200000 IJ SOLN
INTRAMUSCULAR | Status: AC
  Filled 2022-09-04: qty 1

## 2022-09-04 MED ORDER — HYDROCHLOROTHIAZIDE 25 MG PO TABS
25.0000 mg | ORAL_TABLET | Freq: Every day | ORAL | Status: DC
  Administered 2022-09-04: 25 mg via ORAL
  Filled 2022-09-04: qty 1

## 2022-09-04 MED ORDER — CHLORHEXIDINE GLUCONATE 0.12 % MT SOLN
15.0000 mL | Freq: Once | OROMUCOSAL | Status: AC
  Administered 2022-09-04: 15 mL via OROMUCOSAL
  Filled 2022-09-04: qty 15

## 2022-09-04 MED ORDER — FOLIC ACID 1 MG PO TABS
1.0000 mg | ORAL_TABLET | Freq: Every day | ORAL | Status: DC
  Administered 2022-09-04: 1 mg via ORAL
  Filled 2022-09-04: qty 1

## 2022-09-04 MED ORDER — SODIUM CHLORIDE 0.9% FLUSH
3.0000 mL | Freq: Two times a day (BID) | INTRAVENOUS | Status: DC
  Administered 2022-09-04: 3 mL via INTRAVENOUS

## 2022-09-04 MED ORDER — SENNOSIDES-DOCUSATE SODIUM 8.6-50 MG PO TABS: 1.0000 | ORAL_TABLET | Freq: Every evening | ORAL | Status: DC | PRN

## 2022-09-04 MED ORDER — LACTATED RINGERS IV SOLN: INTRAVENOUS | Status: DC

## 2022-09-04 MED ORDER — PROPOFOL 10 MG/ML IV BOLUS
INTRAVENOUS | Status: DC | PRN
  Administered 2022-09-04: 120 mg via INTRAVENOUS
  Administered 2022-09-04: 50 mg via INTRAVENOUS

## 2022-09-04 MED ORDER — FENTANYL CITRATE (PF) 100 MCG/2ML IJ SOLN: 25.0000 ug | INTRAMUSCULAR | Status: DC | PRN

## 2022-09-04 MED ORDER — METOPROLOL SUCCINATE ER 50 MG PO TB24
50.0000 mg | ORAL_TABLET | Freq: Two times a day (BID) | ORAL | Status: DC
  Filled 2022-09-04: qty 1

## 2022-09-04 MED ORDER — SODIUM CHLORIDE 0.9 % IV SOLN: 250.0000 mL | INTRAVENOUS | Status: DC

## 2022-09-04 MED ORDER — MORPHINE SULFATE (PF) 2 MG/ML IV SOLN: 2.0000 mg | INTRAVENOUS | Status: DC | PRN

## 2022-09-04 MED ORDER — PHENYLEPHRINE HCL-NACL 20-0.9 MG/250ML-% IV SOLN
INTRAVENOUS | Status: DC | PRN
  Administered 2022-09-04: 45 ug/min via INTRAVENOUS

## 2022-09-04 MED ORDER — LIDOCAINE-EPINEPHRINE 0.5 %-1:200000 IJ SOLN
INTRAMUSCULAR | Status: DC | PRN
  Administered 2022-09-04: 4 mL

## 2022-09-04 MED ORDER — ONDANSETRON HCL 4 MG PO TABS: 4.0000 mg | ORAL_TABLET | Freq: Four times a day (QID) | ORAL | Status: DC | PRN

## 2022-09-04 MED ORDER — ROCURONIUM BROMIDE 10 MG/ML (PF) SYRINGE
PREFILLED_SYRINGE | INTRAVENOUS | Status: DC | PRN
  Administered 2022-09-04: 60 mg via INTRAVENOUS
  Administered 2022-09-04: 20 mg via INTRAVENOUS

## 2022-09-04 MED ORDER — LOSARTAN POTASSIUM-HCTZ 100-25 MG PO TABS: 1.0000 | ORAL_TABLET | Freq: Every day | ORAL | Status: DC

## 2022-09-04 MED ORDER — CHLORHEXIDINE GLUCONATE CLOTH 2 % EX PADS: 6.0000 | MEDICATED_PAD | Freq: Once | CUTANEOUS | Status: DC

## 2022-09-04 MED ORDER — POTASSIUM CHLORIDE IN NACL 20-0.9 MEQ/L-% IV SOLN: INTRAVENOUS | Status: DC

## 2022-09-04 MED ORDER — SODIUM CHLORIDE 0.9% FLUSH
3.0000 mL | INTRAVENOUS | Status: DC | PRN
  Administered 2022-09-04: 3 mL via INTRAVENOUS

## 2022-09-04 MED ORDER — HYDROCODONE-ACETAMINOPHEN 7.5-325 MG PO TABS
1.0000 | ORAL_TABLET | Freq: Four times a day (QID) | ORAL | Status: DC
  Filled 2022-09-04: qty 1

## 2022-09-04 MED ORDER — KETOROLAC TROMETHAMINE 15 MG/ML IJ SOLN
7.5000 mg | Freq: Four times a day (QID) | INTRAMUSCULAR | Status: DC
  Administered 2022-09-04 – 2022-09-05 (×2): 7.5 mg via INTRAVENOUS
  Filled 2022-09-04 (×2): qty 1

## 2022-09-04 MED ORDER — HYDROCHLOROTHIAZIDE 25 MG PO TABS: 25.0000 mg | ORAL_TABLET | Freq: Every day | ORAL | Status: DC

## 2022-09-04 MED ORDER — INSULIN ASPART 100 UNIT/ML IJ SOLN: 0.0000 [IU] | INTRAMUSCULAR | Status: DC | PRN

## 2022-09-04 MED ORDER — ONDANSETRON HCL 4 MG/2ML IJ SOLN: 4.0000 mg | Freq: Four times a day (QID) | INTRAMUSCULAR | Status: DC | PRN

## 2022-09-04 MED ORDER — OXYCODONE HCL 5 MG PO TABS: 5.0000 mg | ORAL_TABLET | Freq: Once | ORAL | Status: DC | PRN

## 2022-09-04 MED ORDER — MENTHOL 3 MG MT LOZG: 1.0000 | LOZENGE | OROMUCOSAL | Status: DC | PRN

## 2022-09-04 MED ORDER — DIAZEPAM 5 MG PO TABS: 5.0000 mg | ORAL_TABLET | Freq: Four times a day (QID) | ORAL | Status: DC | PRN

## 2022-09-04 MED ORDER — OXYCODONE HCL 5 MG PO TABS: 10.0000 mg | ORAL_TABLET | ORAL | Status: DC | PRN

## 2022-09-04 MED ORDER — ONDANSETRON HCL 4 MG/2ML IJ SOLN
INTRAMUSCULAR | Status: DC | PRN
  Administered 2022-09-04: 4 mg via INTRAVENOUS

## 2022-09-04 MED ORDER — ACETAMINOPHEN 650 MG RE SUPP: 650.0000 mg | RECTAL | Status: DC | PRN

## 2022-09-04 MED ORDER — ORAL CARE MOUTH RINSE: 15.0000 mL | Freq: Once | OROMUCOSAL | Status: AC

## 2022-09-04 MED ORDER — FENTANYL CITRATE (PF) 250 MCG/5ML IJ SOLN
INTRAMUSCULAR | Status: AC
  Filled 2022-09-04: qty 5

## 2022-09-04 SURGICAL SUPPLY — 52 items
ADH SKN CLS APL DERMABOND .7 (GAUZE/BANDAGES/DRESSINGS) ×1
APL SKNCLS STERI-STRIP NONHPOA (GAUZE/BANDAGES/DRESSINGS)
BAG COUNTER SPONGE SURGICOUNT (BAG) ×1 IMPLANT
BAG SPNG CNTER NS LX DISP (BAG) ×1
BAND INSRT 18 STRL LF DISP RB (MISCELLANEOUS) ×2
BAND RUBBER #18 3X1/16 STRL (MISCELLANEOUS) ×2 IMPLANT
BENZOIN TINCTURE PRP APPL 2/3 (GAUZE/BANDAGES/DRESSINGS) IMPLANT
BLADE CLIPPER SURG (BLADE) IMPLANT
BUR MATCHSTICK NEURO 3.0 LAGG (BURR) ×1 IMPLANT
BUR PRECISION FLUTE 5.0 (BURR) IMPLANT
CANISTER SUCT 3000ML PPV (MISCELLANEOUS) ×1 IMPLANT
CARTRIDGE OIL MAESTRO DRILL (MISCELLANEOUS) ×1 IMPLANT
DERMABOND ADVANCED .7 DNX12 (GAUZE/BANDAGES/DRESSINGS) ×1 IMPLANT
DIFFUSER DRILL AIR PNEUMATIC (MISCELLANEOUS) ×1 IMPLANT
DRAPE LAPAROTOMY 100X72X124 (DRAPES) ×1 IMPLANT
DRAPE MICROSCOPE SLANT 54X150 (MISCELLANEOUS) ×1 IMPLANT
DRAPE SURG 17X23 STRL (DRAPES) ×1 IMPLANT
DURAPREP 26ML APPLICATOR (WOUND CARE) ×1 IMPLANT
ELECT BLADE 4.0 EZ CLEAN MEGAD (MISCELLANEOUS) ×1
ELECT REM PT RETURN 9FT ADLT (ELECTROSURGICAL) ×1
ELECTRODE BLDE 4.0 EZ CLN MEGD (MISCELLANEOUS) IMPLANT
ELECTRODE REM PT RTRN 9FT ADLT (ELECTROSURGICAL) ×1 IMPLANT
GAUZE 4X4 16PLY ~~LOC~~+RFID DBL (SPONGE) IMPLANT
GAUZE SPONGE 4X4 12PLY STRL (GAUZE/BANDAGES/DRESSINGS) IMPLANT
GLOVE ECLIPSE 6.5 STRL STRAW (GLOVE) ×1 IMPLANT
GLOVE EXAM NITRILE XL STR (GLOVE) IMPLANT
GOWN STRL REUS W/ TWL LRG LVL3 (GOWN DISPOSABLE) ×2 IMPLANT
GOWN STRL REUS W/ TWL XL LVL3 (GOWN DISPOSABLE) IMPLANT
GOWN STRL REUS W/TWL 2XL LVL3 (GOWN DISPOSABLE) IMPLANT
GOWN STRL REUS W/TWL LRG LVL3 (GOWN DISPOSABLE) ×2
GOWN STRL REUS W/TWL XL LVL3 (GOWN DISPOSABLE)
KIT BASIN OR (CUSTOM PROCEDURE TRAY) ×1 IMPLANT
KIT TURNOVER KIT B (KITS) ×1 IMPLANT
NDL HYPO 25X1 1.5 SAFETY (NEEDLE) ×1 IMPLANT
NDL SPNL 18GX3.5 QUINCKE PK (NEEDLE) IMPLANT
NEEDLE HYPO 25X1 1.5 SAFETY (NEEDLE) ×1 IMPLANT
NEEDLE SPNL 18GX3.5 QUINCKE PK (NEEDLE) IMPLANT
NS IRRIG 1000ML POUR BTL (IV SOLUTION) ×1 IMPLANT
OIL CARTRIDGE MAESTRO DRILL (MISCELLANEOUS) ×1
PACK LAMINECTOMY NEURO (CUSTOM PROCEDURE TRAY) ×1 IMPLANT
PAD ARMBOARD 7.5X6 YLW CONV (MISCELLANEOUS) ×3 IMPLANT
SPIKE FLUID TRANSFER (MISCELLANEOUS) ×1 IMPLANT
SPONGE SURGIFOAM ABS GEL SZ50 (HEMOSTASIS) ×1 IMPLANT
SPONGE T-LAP 4X18 ~~LOC~~+RFID (SPONGE) IMPLANT
STRIP CLOSURE SKIN 1/2X4 (GAUZE/BANDAGES/DRESSINGS) IMPLANT
SUT VIC AB 0 CT1 18XCR BRD8 (SUTURE) ×1 IMPLANT
SUT VIC AB 0 CT1 8-18 (SUTURE) ×1
SUT VIC AB 2-0 CT1 18 (SUTURE) ×1 IMPLANT
SUT VIC AB 3-0 SH 8-18 (SUTURE) ×1 IMPLANT
TOWEL GREEN STERILE (TOWEL DISPOSABLE) ×1 IMPLANT
TOWEL GREEN STERILE FF (TOWEL DISPOSABLE) ×1 IMPLANT
WATER STERILE IRR 1000ML POUR (IV SOLUTION) ×1 IMPLANT

## 2022-09-04 NOTE — Transfer of Care (Signed)
Immediate Anesthesia Transfer of Care Note  Patient: George Jefferson  Procedure(s) Performed: Sublaminar decompression Lumbar one-two (Spine Lumbar)  Patient Location: PACU  Anesthesia Type:General  Level of Consciousness: drowsy  Airway & Oxygen Therapy: Patient Spontanous Breathing and Patient connected to face mask oxygen  Post-op Assessment: Report given to RN and Post -op Vital signs reviewed and stable  Post vital signs: Reviewed and stable  Last Vitals:  Vitals Value Taken Time  BP    Temp    Pulse    Resp    SpO2      Last Pain:  Vitals:   09/04/22 1124  TempSrc:   PainSc: 0-No pain         Complications:  Encounter Notable Events  Notable Event Outcome Phase Comment  Difficult to intubate - expected  Intraprocedure Filed from anesthesia note documentation.

## 2022-09-04 NOTE — H&P (Signed)
BP (!) 168/63   Pulse 66   Temp 97.6 F (36.4 C) (Oral)   Resp 17   Ht 6' (1.829 m)   Wt 102.1 kg   SpO2 98%   BMI 30.52 kg/m  Mr. Duarte is a former patient of mine whom I took to the operating room in 2003 and at that time did an L3, L4, and L5 lumbar decompression for stenosis.  He returns today complaining of similar symptoms, pain with walking, pain with standing.  He recently took a trip with his family and his daughter believes that that is what truly tipped him over the edge in turns of he was not able to do the things he wanted nor enjoy himself the way that he would have liked, and at that time, he decided he needed to come back to see me.  He is 6 feet 1 inch, weighs 229 pounds.  Temperature is 96.7, blood pressure is 200/89, pulse 59, respiratory rate 20. On Mr. Kees's intake sheet, he says his legs just get weak whenever he tries to walk much and has a lot of back pain.  He says it is chronic.  He has had 2 neck surgeries, back surgery, cholecystectomy.  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     MEDICATIONS :  He is taking Folic Acid, Toprol, Aspirin, CoQ10, and Losartan.     ALLERGIES :  He has no known drug allergies.     REVIEW OF SYSTEMS :  Positive for easy bruising, generalized weakness, seasonal allergies, joint pain, muscle weakness, arm and leg pain, numbness, tingling and back pain.     PAST MEDICAL HISTORY :  Significant for hypertension, stroke, and skin cancer is present in the family history.  He does feel weakness in his legs.  He can sit for 20 minutes, stand for 20 minutes or walk for 10. No bowel or bladder dysfunction.     SOCIAL HISTORY :  He is widowed.  He does live alone.  He does have children.  He is right handed.  He does not use alcohol. He has used tobacco.     PHYSICAL EXAMINATION :  On exam, he has 5/5 strength in the upper and lower extremities.  Normal muscle tone, bulk, and  coordination.  Pupils equal, round, and reactive to light.  Full extraocular movements.  Full visual fields.  Hearing intact to voice.  Uvula elevates in midline.  Shoulder shrug is normal.  Tongue protrudes in the midline.  Normal muscle tone and bulk.     ASSESSMENT AND PLAN :  MRI shows stenosis present at 2-3 just above the level of the decompression.  He has some areas of foraminal narrowing in the lower back, but nothing that I think corresponds to the clear-cut neurogenic claudication which he describes.  I have recommended since he has had time and is losing ability to just undergo a simple lumbar decompression.  We do not need hardware.  We do not need to fuse him.  We just need to open up the canal at 2-3.  We will get this done some time after September 4.  I expect him to be in the hospital maybe a day.  They might recommend inpatient rehab, but he is moving fairly well, so we would have to see.  Risks and benefits he is well-versed in, having had the operation.

## 2022-09-04 NOTE — Anesthesia Postprocedure Evaluation (Signed)
Anesthesia Post Note  Patient: George Jefferson  Procedure(s) Performed: Sublaminar decompression Lumbar one-two (Spine Lumbar)     Patient location during evaluation: PACU Anesthesia Type: General Level of consciousness: awake and alert, patient cooperative and oriented Pain management: pain level controlled Vital Signs Assessment: post-procedure vital signs reviewed and stable Respiratory status: spontaneous breathing, nonlabored ventilation and respiratory function stable Cardiovascular status: blood pressure returned to baseline and stable Postop Assessment: no apparent nausea or vomiting and able to ambulate Anesthetic complications: yes   Encounter Notable Events  Notable Event Outcome Phase Comment  Difficult to intubate - expected  Intraprocedure Filed from anesthesia note documentation.    Last Vitals:  Vitals:   09/04/22 1805 09/04/22 1816  BP: (!) 154/70 (!) 165/66  Pulse: (!) 57 (!) 55  Resp: 14 16  Temp: 36.7 C   SpO2: 97% 99%    Last Pain:  Vitals:   09/04/22 1805  TempSrc:   PainSc: 0-No pain    LLE Motor Response: Purposeful movement;Responds to commands (09/04/22 1805) LLE Sensation: Tingling (09/04/22 1805) RLE Motor Response: Purposeful movement;Responds to commands (09/04/22 1805) RLE Sensation: Tingling (09/04/22 1805)      Seleta Rhymes. Saniyyah Elster

## 2022-09-04 NOTE — Anesthesia Procedure Notes (Signed)
Procedure Name: Intubation Date/Time: 09/04/2022 3:46 PM  Performed by: Darletta Moll, CRNAPre-anesthesia Checklist: Patient identified, Emergency Drugs available, Suction available and Patient being monitored Patient Re-evaluated:Patient Re-evaluated prior to induction Oxygen Delivery Method: Circle system utilized Preoxygenation: Pre-oxygenation with 100% oxygen Induction Type: IV induction Ventilation: Mask ventilation without difficulty, Two handed mask ventilation required and Oral airway inserted - appropriate to patient size Laryngoscope Size: 4 and Glidescope Tube type: Oral Tube size: 7.5 mm Number of attempts: 1 Airway Equipment and Method: Stylet and Oral airway Placement Confirmation: ETT inserted through vocal cords under direct vision, positive ETCO2 and breath sounds checked- equal and bilateral Secured at: 23 cm Tube secured with: Tape Dental Injury: Teeth and Oropharynx as per pre-operative assessment  Difficulty Due To: Difficulty was anticipated and Difficult Airway- due to reduced neck mobility

## 2022-09-04 NOTE — Anesthesia Preprocedure Evaluation (Addendum)
Anesthesia Evaluation  Patient identified by MRN, date of birth, ID band Patient awake    Reviewed: Allergy & Precautions, NPO status , Patient's Chart, lab work & pertinent test results, reviewed documented beta blocker date and time   History of Anesthesia Complications Negative for: history of anesthetic complications  Airway Mallampati: II  TM Distance: >3 FB Neck ROM: Full    Dental  (+) Edentulous Lower, Edentulous Upper   Pulmonary former smoker,    Pulmonary exam normal        Cardiovascular hypertension, Pt. on home beta blockers and Pt. on medications Normal cardiovascular exam+ dysrhythmias Atrial Fibrillation      Neuro/Psych CVA, No Residual Symptoms negative psych ROS   GI/Hepatic negative GI ROS, Neg liver ROS,   Endo/Other  diabetes, Type 2 Obesity   Renal/GU Renal InsufficiencyRenal disease     Musculoskeletal negative musculoskeletal ROS (+)   Abdominal   Peds  Hematology negative hematology ROS (+)   Anesthesia Other Findings   Reproductive/Obstetrics                            Anesthesia Physical Anesthesia Plan  ASA: 3  Anesthesia Plan: General   Post-op Pain Management: Tylenol PO (pre-op)*   Induction: Intravenous  PONV Risk Score and Plan: 2 and Treatment may vary due to age or medical condition, Ondansetron and Dexamethasone  Airway Management Planned: Oral ETT  Additional Equipment: None  Intra-op Plan:   Post-operative Plan: Extubation in OR  Informed Consent: I have reviewed the patients History and Physical, chart, labs and discussed the procedure including the risks, benefits and alternatives for the proposed anesthesia with the patient or authorized representative who has indicated his/her understanding and acceptance.     Dental advisory given  Plan Discussed with: CRNA and Anesthesiologist  Anesthesia Plan Comments:        Anesthesia Quick Evaluation

## 2022-09-04 NOTE — Op Note (Signed)
09/04/2022  5:38 PM  PATIENT:  George Jefferson  81 y.o. 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 SURGEON: Surgeon(s): Ashok Pall, MD  ASSISTANTS:none  ANESTHESIA:   general  EBL:  Total I/O In: 1000 [I.V.:1000] Out: 50 [Blood:50]  BLOOD ADMINISTERED:none  CELL SAVER GIVEN:none  COUNT:per nursing  DRAINS: none   SPECIMEN:  No Specimen  DICTATION: George Jefferson was taken to the operating room, intubated, and placed under a general anesthetic without difficulty.  was positioned prone on the wilson frame with all pressure points padded. He was prepped and draped in a sterile manner.  I opened the superior portion of his existing wound and extended it rostrally. I dissected reflecting the spinous muscles laterally exposing the lamina of L1. I performed a near total laminectomy of L1 bilaterally. I removed the lamina with the drill and Kerrison punches.I removed the ligamentum flavum and expanded the lateral portions of the spinal canal. I removed some of the overlying bone in the neural foraminae freeing the L1 and L2 roots.  I being satisfied with the decompression irrigated the wound and closed the incision with vicryl sutures. I dressed the incision with dermabond. He was flipped onto his back on the OR bed.   PLAN OF CARE: Admit for overnight observation  PATIENT DISPOSITION:  PACU - hemodynamically stable.   Delay start of Pharmacological VTE agent (>24hrs) due to surgical blood loss or risk of bleeding:  no

## 2022-09-05 ENCOUNTER — Encounter (HOSPITAL_COMMUNITY): Payer: Self-pay | Admitting: Neurosurgery

## 2022-09-05 DIAGNOSIS — R7309 Other abnormal glucose: Secondary | ICD-10-CM | POA: Diagnosis not present

## 2022-09-05 DIAGNOSIS — M48062 Spinal stenosis, lumbar region with neurogenic claudication: Secondary | ICD-10-CM | POA: Diagnosis not present

## 2022-09-05 LAB — GLUCOSE, CAPILLARY: Glucose-Capillary: 203 mg/dL — ABNORMAL HIGH (ref 70–99)

## 2022-09-05 NOTE — Discharge Instructions (Addendum)

## 2022-09-05 NOTE — Discharge Summary (Signed)
Physician Discharge Summary  Patient ID: George Jefferson MRN: [id number] DOB/AGE: Nov 20, 1941 81 y.o.  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 The patient tolerated the procedure well and was taken to the recovery room and then to the floor in stable condition. The hospital course was routine. There were no complications. The wound remained clean dry and intact. Pt had appropriate back soreness. No complaints of leg pain or new N/T/W. The patient remained afebrile with stable vital signs, and tolerated a regular diet. The patient continued to increase activities, and pain was well controlled with oral pain medications.   Consults: None  Significant Diagnostic Studies:  Results for orders placed or performed during the hospital encounter of 09/04/22  Glucose, capillary  Result Value Ref Range   Glucose-Capillary 112 (H) 70 - 99 mg/dL  Glucose, capillary  Result Value Ref Range   Glucose-Capillary 98 70 - 99 mg/dL  Glucose, capillary  Result Value Ref Range   Glucose-Capillary 236 (H) 70 - 99 mg/dL   Comment 1 Notify RN    Comment 2 Document in Chart   Glucose, capillary  Result Value Ref Range   Glucose-Capillary 203 (H) 70 - 99 mg/dL   Comment 1 Notify RN    Comment 2 Document in Chart     DG Lumbar Spine 2-3 Views  Result Date: 09/04/2022 CLINICAL DATA:  Intraoperative localizing images for sublaminar decompression. L1-2. EXAM: LUMBAR SPINE - 2-3 VIEW COMPARISON:  MRI lumbar spine 08/13/2022 FINDINGS: Single lateral view of the lumbar spine. Localization needle overlies the posterior elements at the inferior L1 level. Vertebral body heights are maintained. High-grade multilevel anterior degenerative bridging  osteophytes. No sagittal spondylolisthesis. IMPRESSION: Localization needle overlies the posterior elements at the inferior L1 level. Electronically Signed   By: Yvonne Kendall M.D.   On: 09/04/2022 17:53   MR Lumbar Spine W Wo Contrast  Result Date: 08/14/2022 CLINICAL DATA:  Back pain scratched at chronic back pain and leg weakness. Fall from 3 story house. EXAM: MRI LUMBAR SPINE WITHOUT AND WITH CONTRAST TECHNIQUE: Multiplanar and multiecho pulse sequences of the lumbar spine were obtained without and with intravenous contrast. CONTRAST:  58m MULTIHANCE GADOBENATE DIMEGLUMINE 529 MG/ML IV SOLN COMPARISON:  None Available. FINDINGS: Segmentation: 5 non rib-bearing lumbar type vertebral bodies are present. The lowest fully formed vertebral body is L5. Alignment: Slight retrolisthesis is present at L1-2. No other significant listhesis is present. Vertebrae: Prominent Schmorl's nodes demonstrate enhancement at L3-4 and L4-5. Posterior Schmorl's node is present at L1-2 on the right. Edematous enhancing endplate changes are present in the inferior left endplate of L1. Conus medullaris and cauda equina: Conus extends to the L1 level. Conus and cauda equina appear normal. Paraspinal and other soft tissues: Limited imaging the abdomen is unremarkable. There is no significant adenopathy. No solid organ lesions are present. Paraspinous muscle atrophy is present at L3-4 and below. Disc levels: T12-L1: Moderate facet hypertrophy is present. No significant disc protrusion or stenosis is present. L1-2: A broad-based disc protrusion is present. Moderate facet hypertrophy and ligamentum flavum thickening is present. This results in moderate central canal stenosis. Subarticular narrowing is worse on the right. Moderate right and mild left foraminal narrowing is present. L2-3: Laminectomy noted. Posterior canal is decompressed. Short pedicles  contribute to mild residual foraminal stenosis bilaterally. L3-4: Laminectomy noted.  Moderate facet hypertrophy is present. The posterior canal is decompressed. Moderate left and mild right foraminal stenosis is present. L4-5: Laminectomy is present. Posterior canal is decompressed. Moderate facet hypertrophy and spurring is present. Mild subarticular and moderate foraminal narrowing is worse right than left. L5-S1: Mild facet hypertrophy and spurring is present. No significant disc protrusion or stenosis is present. IMPRESSION: 1. Laminectomy at L2, L3, and L4 with decompression of the posterior canal. 2. Congenital and acquired stenoses as described. 3. Adjacent level disease with moderate central canal stenosis at L1-2 with subarticular narrowing, worse on the right. 4. Moderate right and mild left foraminal narrowing at L1-2. 5. Moderate left and mild right foraminal stenosis at L3-4. 6. Mild subarticular and moderate foraminal narrowing bilaterally at L4-5 is worse on the right. 7. Mild facet hypertrophy and spurring at L5-S1 without significant disc protrusion or stenosis. 8. Prominent Schmorl's nodes at L1-2, L3-4 and L4-5 and reactive endplate changes at G8-3. Electronically Signed   By: San Morelle M.D.   On: 08/14/2022 15:24    Antibiotics:  Anti-infectives (From admission, onward)    Start     Dose/Rate Route Frequency Ordered Stop   09/04/22 1115  ceFAZolin (ANCEF) IVPB 2g/100 mL premix        2 g 200 mL/hr over 30 Minutes Intravenous On call to O.R. 09/04/22 1111 09/04/22 1546       Discharge Exam: Blood pressure (!) 169/71, pulse (!) 58, temperature 97.6 F (36.4 C), temperature source Oral, resp. rate 18, height 6' (1.829 m), weight 102.1 kg, SpO2 100 %. Neurologic: Grossly normal Ambulating and voiding well incision cdi   Discharge Medications:   Allergies as of 09/05/2022       Reactions   Ciprofloxacin    "Liked to eat my stomach out."         Medication List     STOP taking these medications    predniSONE 20 MG tablet Commonly known as:  DELTASONE       TAKE these medications    aspirin 325 MG tablet Take 325 mg by mouth daily.   CO Q-10 PO Take 1 capsule by mouth daily.   folic acid 1 MG tablet Commonly known as: FOLVITE Take 1 tablet by mouth daily.   losartan-hydrochlorothiazide 100-25 MG tablet Commonly known as: HYZAAR Take 1 tablet by mouth daily.   metoprolol succinate 50 MG 24 hr tablet Commonly known as: TOPROL-XL Take 50 mg by mouth 2 (two) times daily.        Disposition: home   Final Dx: lami L1-2  Discharge Instructions      Remove dressing in 72 hours   Complete by: As directed    Call MD for:  difficulty breathing, headache or visual disturbances   Complete by: As directed    Call MD for:  hives   Complete by: As directed    Call MD for:  persistant dizziness or light-headedness   Complete by: As directed    Call MD for:  persistant nausea and vomiting   Complete by: As directed    Call MD for:  redness, tenderness, or signs of infection (pain, swelling, redness, odor or green/yellow discharge around incision site)   Complete by: As directed    Call MD for:  severe uncontrolled pain   Complete by: As directed    Call MD for:  temperature >100.4   Complete by: As directed  Diet - low sodium heart healthy   Complete by: As directed    Increase activity slowly   Complete by: As directed    Lifting restrictions   Complete by: As directed    No lifting more than 8 lbs          Signed: Ocie Cornfield Demecia Northway 09/05/2022, 7:03 AM

## 2022-09-05 NOTE — Progress Notes (Signed)
Patient is discharged from room 3C02 at this time. Alert and in stable condition. IV site d/c'd and instructions read to patient and spouse with understanding verbalized and all questions answered. Left unit via wheelchair with all belongings at side.  ?

## 2022-09-05 NOTE — Progress Notes (Signed)
PT Cancellation Note  Patient Details Name: ANANIAS KOLANDER MRN: [id number] DOB: 18-Oct-1941   Cancelled Treatment:    Reason Eval/Treat Not Completed: PT screened, no needs identified, will sign off (per pt and OT pt without P.T needs at this time, will screen)   Jazlen Ogarro B Jaimarie Rapozo 09/05/2022, 8:54 AM [place] Office: [phone]

## 2022-09-05 NOTE — Evaluation (Signed)
Occupational Therapy Evaluation Patient Details Name: George Jefferson MRN: [id number] DOB: [birth year] Today's Date: 09/05/2022   History of Present Illness 81 yo male admitted 9/15 for L1-2 laminectomy. PMhx: L3-5 decompression, HTN, cVA, skin CA   Clinical Impression   Pt independent at baseline with ADLs and functional mobility, lives alone but daughter lives nearby and is planning to stay with him for  a few days at d/c. Pt mod I for bed mobility, ADLs, and transfers without AD. Educated on compensatory strategies for ADLs, back precautions, and use of DME pt already has at home for safety; pt verbalized understanding, adheres well to precautions throughout session. Pt presenting with impairments listed below, however has no acute OT needs at this time, will s/o. Please reconsult if there is a change in pt status. Recommend d/c home with family assistance.     Recommendations for follow up therapy are one component of a multi-disciplinary discharge planning process, led by the attending physician.  Recommendations may be updated based on patient status, additional functional criteria and insurance authorization.   Follow Up Recommendations  No OT follow up    Assistance Recommended at Discharge Set up Supervision/Assistance  Patient can return home with the following A little help with bathing/dressing/bathroom;Assistance with cooking/housework;Assist for transportation    Functional Status Assessment  Patient has had a recent decline in their functional status and demonstrates the ability to make significant improvements in function in a reasonable and predictable amount of time.  Equipment Recommendations  None recommended by OT (pt has all needed DME)    Recommendations for Other Services       Precautions / Restrictions Precautions Precautions: Back Precaution Booklet Issued: Yes (comment) Precaution Comments: educated on 3/3 back prec Required Braces or Orthoses:  (no brace  needed per MD) Restrictions Weight Bearing Restrictions: No      Mobility Bed Mobility Overal bed mobility: Modified Independent             General bed mobility comments: use of log roll technique    Transfers Overall transfer level: Modified independent Equipment used: None                      Balance Overall balance assessment: No apparent balance deficits (not formally assessed)                                         ADL either performed or assessed with clinical judgement   ADL Overall ADL's : At baseline                                       General ADL Comments: completes UB/LB dressing, and simulated toilet and tub transfer with mod I     Vision   Vision Assessment?: No apparent visual deficits     Perception     Praxis      Pertinent Vitals/Pain Pain Assessment Pain Assessment: No/denies pain     Hand Dominance     Extremity/Trunk Assessment Upper Extremity Assessment Upper Extremity Assessment: Overall WFL for tasks assessed   Lower Extremity Assessment Lower Extremity Assessment: Overall WFL for tasks assessed   Cervical / Trunk Assessment Cervical / Trunk Assessment: Back Surgery   Communication Communication Communication: No difficulties   Cognition Arousal/Alertness: Awake/alert Behavior During Therapy:  WFL for tasks assessed/performed Overall Cognitive Status: Within Functional Limits for tasks assessed                                       General Comments  VSS on RA, daughter present in room    Exercises     Shoulder [place] expects to be discharged to:: Private residence Living Arrangements: Alone Available Help at Discharge: Family;Available PRN/intermittently Type of Home: House Home Access: Ramped entrance     Home Layout: Two level;Able to live on main level with bedroom/bathroom     Bathroom Shower/Tub:  Tub/shower unit   Bathroom Toilet: Handicapped height     Home Equipment: Conservation officer, nature (2 wheels);Cane - single point;BSC/3in1;Wheelchair - manual   Additional Comments: daughter plans to stay with pt for first few days, also lives right down the street      Prior Functioning/Environment Prior Level of Function : Independent/Modified Independent;Driving             Mobility Comments: no AD use ADLs Comments: ind, manages upkeep for 2 homes        OT Problem List: Decreased strength;Decreased range of motion;Decreased activity tolerance;Decreased knowledge of precautions      OT Treatment/Interventions:      OT Goals(Current goals can be found in the care plan section) Acute Rehab OT Goals Patient Stated Goal: to go home OT Goal Formulation: With patient Time For Goal Achievement: 09/19/22 Potential to Achieve Goals: Good  OT Frequency:      Co-evaluation              AM-PAC OT "6 Clicks" Daily Activity     Outcome Measure Help from another person eating meals?: None Help from another person taking care of personal grooming?: None Help from another person toileting, which includes using toliet, bedpan, or urinal?: None Help from another person bathing (including washing, rinsing, drying)?: A Little Help from another person to put on and taking off regular upper body clothing?: None Help from another person to put on and taking off regular lower body clothing?: A Little 6 Click Score: 22   End of Session Nurse Communication: Mobility status  Activity Tolerance: Patient tolerated treatment well Patient left: in bed;with call bell/phone within reach;with family/visitor present (on EOB)  OT Visit Diagnosis: Unsteadiness on feet (R26.81);Other abnormalities of gait and mobility (R26.89);Muscle weakness (generalized) (M62.81)                Time: 7322-0254 OT Time Calculation (min): 17 min Charges:  OT General Charges $OT Visit: 1 Visit OT Evaluation $OT  Eval Low Complexity: 1 Low  Lynnda Child, OTD, OTR/L Acute Rehab [id number]) [phone] - [place] 09/05/2022, 8:18 AM

## 2022-09-07 LAB — GLUCOSE, CAPILLARY: Glucose-Capillary: 99 mg/dL (ref 70–99)

## 2022-11-05 DIAGNOSIS — R200 Anesthesia of skin: Secondary | ICD-10-CM | POA: Diagnosis not present

## 2022-11-05 DIAGNOSIS — G5603 Carpal tunnel syndrome, bilateral upper limbs: Secondary | ICD-10-CM | POA: Diagnosis not present

## 2022-11-05 DIAGNOSIS — M79642 Pain in left hand: Secondary | ICD-10-CM | POA: Diagnosis not present

## 2022-11-05 DIAGNOSIS — M79641 Pain in right hand: Secondary | ICD-10-CM | POA: Diagnosis not present

## 2022-12-18 DIAGNOSIS — G5602 Carpal tunnel syndrome, left upper limb: Secondary | ICD-10-CM | POA: Diagnosis not present

## 2024-07-10 ENCOUNTER — Encounter (HOSPITAL_COMMUNITY): Payer: Self-pay | Admitting: Emergency Medicine

## 2024-07-10 ENCOUNTER — Other Ambulatory Visit: Payer: Self-pay

## 2024-07-10 ENCOUNTER — Inpatient Hospital Stay (HOSPITAL_COMMUNITY)
Admission: EM | Admit: 2024-07-10 | Discharge: 2024-07-13 | DRG: 638 | Disposition: A | Discharge status: Home / Self Care | Attending: Family Medicine | Admitting: Family Medicine

## 2024-07-10 ENCOUNTER — Emergency Department (HOSPITAL_COMMUNITY)

## 2024-07-10 DIAGNOSIS — L600 Ingrowing nail: Secondary | ICD-10-CM | POA: Diagnosis present

## 2024-07-10 DIAGNOSIS — E119 Type 2 diabetes mellitus without complications: Secondary | ICD-10-CM | POA: Diagnosis not present

## 2024-07-10 DIAGNOSIS — Z882 Allergy status to sulfonamides status: Secondary | ICD-10-CM | POA: Diagnosis not present

## 2024-07-10 DIAGNOSIS — I160 Hypertensive urgency: Secondary | ICD-10-CM | POA: Diagnosis present

## 2024-07-10 DIAGNOSIS — Z7982 Long term (current) use of aspirin: Secondary | ICD-10-CM

## 2024-07-10 DIAGNOSIS — Z833 Family history of diabetes mellitus: Secondary | ICD-10-CM | POA: Diagnosis not present

## 2024-07-10 DIAGNOSIS — L03116 Cellulitis of left lower limb: Principal | ICD-10-CM | POA: Diagnosis present

## 2024-07-10 DIAGNOSIS — N1832 Chronic kidney disease, stage 3b: Secondary | ICD-10-CM | POA: Diagnosis present

## 2024-07-10 DIAGNOSIS — Z888 Allergy status to other drugs, medicaments and biological substances status: Secondary | ICD-10-CM | POA: Diagnosis not present

## 2024-07-10 DIAGNOSIS — E11621 Type 2 diabetes mellitus with foot ulcer: Principal | ICD-10-CM | POA: Diagnosis present

## 2024-07-10 DIAGNOSIS — Z87891 Personal history of nicotine dependence: Secondary | ICD-10-CM | POA: Diagnosis not present

## 2024-07-10 DIAGNOSIS — Z8673 Personal history of transient ischemic attack (TIA), and cerebral infarction without residual deficits: Secondary | ICD-10-CM | POA: Diagnosis not present

## 2024-07-10 DIAGNOSIS — N189 Chronic kidney disease, unspecified: Secondary | ICD-10-CM

## 2024-07-10 DIAGNOSIS — I129 Hypertensive chronic kidney disease with stage 1 through stage 4 chronic kidney disease, or unspecified chronic kidney disease: Secondary | ICD-10-CM | POA: Diagnosis present

## 2024-07-10 DIAGNOSIS — Z85828 Personal history of other malignant neoplasm of skin: Secondary | ICD-10-CM | POA: Diagnosis not present

## 2024-07-10 DIAGNOSIS — I10 Essential (primary) hypertension: Secondary | ICD-10-CM | POA: Diagnosis present

## 2024-07-10 DIAGNOSIS — E1122 Type 2 diabetes mellitus with diabetic chronic kidney disease: Secondary | ICD-10-CM | POA: Diagnosis present

## 2024-07-10 DIAGNOSIS — Z79899 Other long term (current) drug therapy: Secondary | ICD-10-CM

## 2024-07-10 LAB — COMPREHENSIVE METABOLIC PANEL WITH GFR
ALT: 14 U/L (ref 0–44)
AST: 20 U/L (ref 15–41)
Albumin: 3.6 g/dL (ref 3.5–5.0)
Alkaline Phosphatase: 85 U/L (ref 38–126)
Anion gap: 12 (ref 5–15)
BUN: 31 mg/dL — ABNORMAL HIGH (ref 8–23)
CO2: 26 mmol/L (ref 22–32)
Calcium: 8.7 mg/dL — ABNORMAL LOW (ref 8.9–10.3)
Chloride: 100 mmol/L (ref 98–111)
Creatinine, Ser: 1.65 mg/dL — ABNORMAL HIGH (ref 0.61–1.24)
GFR, Estimated: 41 mL/min — ABNORMAL LOW (ref >60)
Glucose, Bld: 146 mg/dL — ABNORMAL HIGH (ref 70–99)
Potassium: 4.2 mmol/L (ref 3.5–5.1)
Sodium: 138 mmol/L (ref 135–145)
Total Bilirubin: 0.4 mg/dL (ref 0.0–1.2)
Total Protein: 7.6 g/dL (ref 6.5–8.1)

## 2024-07-10 LAB — CBC WITH DIFFERENTIAL/PLATELET
Abs Immature Granulocytes: 0.03 K/uL (ref 0.00–0.07)
Basophils Absolute: 0 K/uL (ref 0.0–0.1)
Basophils Relative: 0 %
Eosinophils Absolute: 0.2 K/uL (ref 0.0–0.5)
Eosinophils Relative: 2 %
HCT: 35.3 % — ABNORMAL LOW (ref 39.0–52.0)
Hemoglobin: 11.9 g/dL — ABNORMAL LOW (ref 13.0–17.0)
Immature Granulocytes: 0 %
Lymphocytes Relative: 25 %
Lymphs Abs: 2 K/uL (ref 0.7–4.0)
MCH: 32 pg (ref 26.0–34.0)
MCHC: 33.7 g/dL (ref 30.0–36.0)
MCV: 94.9 fL (ref 80.0–100.0)
Monocytes Absolute: 0.8 K/uL (ref 0.1–1.0)
Monocytes Relative: 10 %
Neutro Abs: 4.8 K/uL (ref 1.7–7.7)
Neutrophils Relative %: 63 %
Platelets: 220 K/uL (ref 150–400)
RBC: 3.72 MIL/uL — ABNORMAL LOW (ref 4.22–5.81)
RDW: 13.8 % (ref 11.5–15.5)
WBC: 7.8 K/uL (ref 4.0–10.5)
nRBC: 0 % (ref 0.0–0.2)

## 2024-07-10 LAB — CBG MONITORING, ED: Glucose-Capillary: 148 mg/dL — ABNORMAL HIGH (ref 70–99)

## 2024-07-10 LAB — LACTIC ACID, PLASMA: Lactic Acid, Venous: 0.9 mmol/L (ref 0.5–1.9)

## 2024-07-10 MED ORDER — ENOXAPARIN SODIUM 40 MG/0.4ML IJ SOSY
40.0000 mg | PREFILLED_SYRINGE | INTRAMUSCULAR | Status: DC
  Administered 2024-07-11 – 2024-07-13 (×3): 40 mg via SUBCUTANEOUS
  Filled 2024-07-10 (×3): qty 0.4

## 2024-07-10 MED ORDER — ACETAMINOPHEN 325 MG PO TABS: 650.0000 mg | ORAL_TABLET | Freq: Four times a day (QID) | ORAL | Status: DC | PRN

## 2024-07-10 MED ORDER — SODIUM CHLORIDE 0.9 % IV BOLUS
1000.0000 mL | Freq: Once | INTRAVENOUS | Status: AC
  Administered 2024-07-10: 1000 mL via INTRAVENOUS

## 2024-07-10 MED ORDER — OXYCODONE HCL 5 MG PO TABS: 5.0000 mg | ORAL_TABLET | ORAL | Status: DC | PRN

## 2024-07-10 MED ORDER — SODIUM CHLORIDE 0.9 % IV SOLN
3.0000 g | Freq: Four times a day (QID) | INTRAVENOUS | Status: DC
  Administered 2024-07-11 – 2024-07-13 (×10): 3 g via INTRAVENOUS
  Filled 2024-07-10 (×12): qty 8

## 2024-07-10 MED ORDER — LOSARTAN POTASSIUM 50 MG PO TABS
100.0000 mg | ORAL_TABLET | Freq: Every day | ORAL | Status: DC
  Administered 2024-07-11 – 2024-07-12 (×2): 100 mg via ORAL
  Filled 2024-07-10 (×3): qty 2

## 2024-07-10 MED ORDER — HYDRALAZINE HCL 50 MG PO TABS: 50.0000 mg | ORAL_TABLET | Freq: Four times a day (QID) | ORAL | Status: DC | PRN

## 2024-07-10 MED ORDER — INSULIN ASPART 100 UNIT/ML IJ SOLN
0.0000 [IU] | Freq: Three times a day (TID) | INTRAMUSCULAR | Status: DC
  Administered 2024-07-11: 2 [IU] via SUBCUTANEOUS
  Administered 2024-07-12: 1 [IU] via SUBCUTANEOUS
  Administered 2024-07-13: 2 [IU] via SUBCUTANEOUS

## 2024-07-10 MED ORDER — METOPROLOL SUCCINATE ER 50 MG PO TB24
50.0000 mg | ORAL_TABLET | Freq: Two times a day (BID) | ORAL | Status: DC
  Administered 2024-07-10 – 2024-07-12 (×5): 50 mg via ORAL
  Filled 2024-07-10 (×6): qty 1

## 2024-07-10 MED ORDER — PIPERACILLIN-TAZOBACTAM 3.375 G IVPB 30 MIN
3.3750 g | Freq: Once | INTRAVENOUS | Status: AC
  Administered 2024-07-10: 3.375 g via INTRAVENOUS
  Filled 2024-07-10: qty 50

## 2024-07-10 MED ORDER — VANCOMYCIN HCL IN DEXTROSE 1-5 GM/200ML-% IV SOLN
1000.0000 mg | Freq: Once | INTRAVENOUS | Status: AC
  Administered 2024-07-10: 1000 mg via INTRAVENOUS
  Filled 2024-07-10: qty 200

## 2024-07-10 MED ORDER — ACETAMINOPHEN 650 MG RE SUPP: 650.0000 mg | Freq: Four times a day (QID) | RECTAL | Status: DC | PRN

## 2024-07-10 MED ORDER — INSULIN ASPART 100 UNIT/ML IJ SOLN: 0.0000 [IU] | Freq: Every day | INTRAMUSCULAR | Status: DC

## 2024-07-10 MED ORDER — SODIUM CHLORIDE 0.9% FLUSH
3.0000 mL | Freq: Two times a day (BID) | INTRAVENOUS | Status: DC
  Administered 2024-07-11 – 2024-07-13 (×4): 3 mL via INTRAVENOUS

## 2024-07-10 MED ORDER — LINEZOLID 600 MG PO TABS
600.0000 mg | ORAL_TABLET | Freq: Two times a day (BID) | ORAL | Status: DC
  Administered 2024-07-11 – 2024-07-13 (×5): 600 mg via ORAL
  Filled 2024-07-10 (×7): qty 1

## 2024-07-10 MED ORDER — PROCHLORPERAZINE EDISYLATE 10 MG/2ML IJ SOLN: 5.0000 mg | Freq: Four times a day (QID) | INTRAMUSCULAR | Status: DC | PRN

## 2024-07-10 MED ORDER — HYDROCHLOROTHIAZIDE 25 MG PO TABS
25.0000 mg | ORAL_TABLET | Freq: Every day | ORAL | Status: DC
  Administered 2024-07-11 – 2024-07-12 (×2): 25 mg via ORAL
  Filled 2024-07-10 (×3): qty 1

## 2024-07-10 MED ORDER — ACETAMINOPHEN 325 MG PO TABS
650.0000 mg | ORAL_TABLET | Freq: Once | ORAL | Status: AC
  Administered 2024-07-10: 650 mg via ORAL
  Filled 2024-07-10: qty 2

## 2024-07-10 MED ORDER — SENNA 8.6 MG PO TABS: 1.0000 | ORAL_TABLET | Freq: Every day | ORAL | Status: DC | PRN

## 2024-07-10 MED ORDER — FENTANYL CITRATE (PF) 100 MCG/2ML IJ SOLN: 12.5000 ug | INTRAMUSCULAR | Status: DC | PRN

## 2024-07-10 MED ORDER — LOSARTAN POTASSIUM-HCTZ 100-25 MG PO TABS: 1.0000 | ORAL_TABLET | Freq: Every day | ORAL | Status: DC

## 2024-07-10 NOTE — ED Provider Notes (Signed)
 [place] EMERGENCY DEPARTMENT AT [facility] Provider Note   CSN: [id number] Arrival date & time: 07/10/24  2031     Patient presents with: Toe Pain   George Jefferson is a 83 y.o. male.   Patient is an 83 year old male who presents emergency department with a chief complaint of redness and swelling to the first, second, third toe of the left foot.  He notes that he did clip his toenails last week attempting to remove an ingrown toenail and notes that he has developed swelling since that time.  He notes he did develop blistering over his foot and did pop them with a needle at home.  Patient notes that he does not believe that he is diabetic.  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  He denies any associated fever or chills.   Toe Pain       Prior to Admission medications   Medication Sig Start Date End Date Taking? Authorizing Provider  aspirin 325 MG tablet Take 325 mg by mouth daily.    [provider]  Coenzyme Q10 (CO Q-10 PO) Take 1 capsule by mouth daily.    [provider]  folic acid  (FOLVITE ) 1 MG tablet Take 1 tablet by mouth daily. 04/06/17   [provider]  losartan -hydrochlorothiazide  (HYZAAR) 100-25 MG tablet Take 1 tablet by mouth daily. 06/19/22   [provider]  metoprolol  succinate (TOPROL -XL) 50 MG 24 hr tablet Take 50 mg by mouth 2 (two) times daily. 07/06/17   [provider]    Allergies: Ciprofloxacin    Review of Systems  Musculoskeletal:        Redness, swelling to the toes of the left foot  All other systems reviewed and are negative.   Updated Vital Signs BP (!) 212/70   Pulse 76   Temp 99.6 F (37.6 C) (Oral)   Resp 18   Ht 6' (1.829 m)   Wt 95.3 kg   SpO2 98%   BMI 28.48 kg/m   Physical Exam Vitals and nursing note reviewed.  Constitutional:      Appearance: Normal appearance.  HENT:     Head: Normocephalic and atraumatic.     Nose: Nose  normal.     Mouth/Throat:     Mouth: Mucous membranes are moist.  Eyes:     Extraocular Movements: Extraocular movements intact.     Conjunctiva/sclera: Conjunctivae normal.     Pupils: Pupils are equal, round, and reactive to light.  Cardiovascular:     Rate and Rhythm: Normal rate and regular rhythm.     Pulses: Normal pulses.     Heart sounds: Normal heart sounds.  Pulmonary:     Effort: Pulmonary effort is normal. No respiratory distress.     Breath sounds: Normal breath sounds.  Abdominal:     General: Abdomen is flat. Bowel sounds are normal.     Palpations: Abdomen is soft.  Musculoskeletal:        General: Normal range of motion.     Cervical back: Normal range of motion and neck supple.     Comments: Erythema, swelling, blistering noted to the first, second, third, fourth toes of the left foot, erythema spreading up into the dorsal aspect of the foot, no gangrenous changes, DP and PT pulse noted with Doppler, sensation intact distally, no obvious deformity or bruising, no areas of induration  Skin:    General: Skin is warm and dry.  Comments: Cellulitic changes noted to the toes on the left foot extending into the dorsal aspect of the left foot  Neurological:     General: No focal deficit present.     Mental Status: He is alert and oriented to person, place, and time. Mental status is at baseline.  Psychiatric:        Mood and Affect: Mood normal.        Behavior: Behavior normal.        Thought Content: Thought content normal.        Judgment: Judgment normal.     (all labs ordered are listed, but only abnormal results are displayed) Labs Reviewed  CBC WITH DIFFERENTIAL/PLATELET - Abnormal; Notable for the following components:      Result Value   RBC 3.72 (*)    Hemoglobin 11.9 (*)    HCT 35.3 (*)    All other components within normal limits  CULTURE, BLOOD (ROUTINE X 2)  CULTURE, BLOOD (ROUTINE X 2)  COMPREHENSIVE METABOLIC PANEL WITH GFR  LACTIC ACID,  PLASMA  LACTIC ACID, PLASMA    EKG: None  Radiology: DG Foot Complete Left Result Date: 07/10/2024 EXAM: 3 or more VIEW(S) XRAY OF THE LEFT FOOT 07/10/2024 08:59:39 PM COMPARISON: None available. CLINICAL HISTORY: Toe pain. Pt c/o wound with redness and swelling to left great toe after cutting his toe nails last week. FINDINGS: BONES AND JOINTS: No acute fracture. No focal osseous lesion. No joint dislocation. Moderate plantar and posterior calcaneal enthesophytes. SOFT TISSUES: Mild soft tissue swelling along the dorsal forefoot. IMPRESSION: 1. No acute osseous abnormality. 2. Mild soft tissue swelling. Electronically signed by: Pinkie Pebbles MD 07/10/2024 09:06 PM EDT RP Workstation: [id number]     Procedures   Medications Ordered in the ED  vancomycin  (VANCOCIN ) IVPB 1000 mg/200 mL premix (has no administration in time range)  piperacillin -tazobactam (ZOSYN ) IVPB 3.375 g (has no administration in time range)  sodium chloride  0.9 % bolus 1,000 mL (has no administration in time range)  acetaminophen  (TYLENOL ) tablet 650 mg (has no administration in time range)                                    Medical Decision Making Amount and/or Complexity of Data Reviewed Labs: ordered. Radiology: ordered.  Risk OTC drugs. Prescription drug management. Decision regarding hospitalization.   This patient presents to the ED for concern of redness, swelling to digits of the left foot, this involves an extensive number of treatment options, and is a complaint that carries with it a high risk of complications and morbidity.  The differential diagnosis includes cellulitis, abscess, gangrene, necrotizing fasciitis, osteomyelitis   Co morbidities that complicate the patient evaluation  None   Additional history obtained:  Additional history obtained from family External records from outside source obtained and reviewed including none   Lab Tests:  I Ordered, and personally  interpreted labs.  The pertinent results include: No leukocytosis, anemia noted, elevated creatinine at baseline, normal electrolytes, normal liver function, negative lactic acid   Imaging Studies ordered:  I ordered imaging studies including x-ray of left foot I independently visualized and interpreted imaging which showed no acute osseous injury or lesions, soft tissue edema I agree with the radiologist interpretation    Consultations Obtained:  I requested consultation with the hospitalist,  and discussed lab and imaging findings as well as pertinent plan - they recommend: Admission  Problem List / ED Course / Critical interventions / Medication management  Patient is doing well at this time and does remain stable.  Discussed with patient that we will plan for admission to hospital service given his apparent cellulitis to the digits of the left foot.  The cellulitis does expand up into the left foot as well.  He has no associated lymphatic streaking.  Do not suspect osteomyelitis or necrotizing fasciitis at this time.  Blood work has otherwise been unremarkable.  Patient did have DP and PT pulses intact with Doppler.  Patient does have stable vital signs at this point and does not actively meet sepsis criteria.  He was started on broad-spectrum antibiotics.  Have discussed patient case with Dr. Charlton with the hospitalist service who has excepted for admission. I ordered medication including bank, Zosyn , Tylenol , IV fluids for cellulitis Reevaluation of the patient after these medicines showed that the patient improved I have reviewed the patients home medicines and have made adjustments as needed   Social Determinants of Health:  None   Test / Admission - Considered:  Admission     Final diagnoses:  None    ED Discharge Orders     None          Daralene Lonni JONETTA DEVONNA 07/10/24 2234    Francesca Elsie CROME, MD 07/11/24 [phone]

## 2024-07-10 NOTE — ED Triage Notes (Signed)
 Pt c/o wound with redness and swelling to left great toe after cutting his toe nails last week.

## 2024-07-10 NOTE — ED Notes (Signed)
 ED TO INPATIENT HANDOFF REPORT  ED Nurse Name and Phone #: Sherlean Medic   S Name/Age/Gender George Jefferson 83 y.o. male Room/Bed: APA05/APA05  Code Status   Code Status: Full Code  Home/SNF/Other Home Patient oriented to: self, place, time, and situation Is this baseline? Yes   Triage Complete: Triage complete  Chief Complaint Cellulitis of left foot [L03.116]  Triage Note Pt c/o wound with redness and swelling to left great toe after cutting his toe nails last week.    Allergies Allergies  Allergen Reactions   Ciprofloxacin     Liked to eat my stomach out.     Level of Care/Admitting Diagnosis ED Disposition     ED Disposition  Admit   Condition  --   Comment  Hospital Area: [facility] [[id number]]  Level of Care: Med-Surg [16]  Covid Evaluation: Asymptomatic - no recent exposure (last 10 days) testing not required  Diagnosis: Cellulitis of left foot [353436]  Admitting Physician: CHARLTON EVALENE RAMAN [[id number]]  Attending Physician: CHARLTON EVALENE RAMAN [[id number]]  Certification:: I certify this patient will need inpatient services for at least 2 midnights  Expected Medical Readiness: 07/13/2024          B Medical/Surgery History Past Medical History:  Diagnosis Date   Arm fracture, right    x 2   Cancer (HCC)    skin cancer right nose   Diabetes mellitus without complication (HCC)    patient stated borderline   Hypertension    Pneumonia    Stroke (HCC) 2006   Past Surgical History:  Procedure Laterality Date   BACK SURGERY     CHOLECYSTECTOMY     cyst removed     spine   HERNIA REPAIR     KNEE SURGERY Left    LUMBAR LAMINECTOMY/DECOMPRESSION MICRODISCECTOMY N/A 09/04/2022   Procedure: Sublaminar decompression Lumbar one-two;  Surgeon: Gillie Duncans, MD;  Location: [facility] OR;  Service: Neurosurgery;  Laterality: N/A;   NECK SURGERY     x 3 fusions   SKIN CANCER EXCISION Right    nose   TONSILLECTOMY       A IV  Location/Drains/Wounds Patient Lines/Drains/Airways Status     Active Line/Drains/Airways     Name Placement date Placement time Site Days   Peripheral IV 07/10/24 20 G Right Antecubital 07/10/24  2151  Antecubital  less than 1   Incision (Closed) 09/04/22 Back 09/04/22  1710  -- 675            Intake/Output Last 24 hours  Intake/Output Summary (Last 24 hours) at 07/10/2024 2307 Last data filed at 07/10/2024 2300 Gross per 24 hour  Intake 1250 ml  Output --  Net 1250 ml    Labs/Imaging Results for orders placed or performed during the hospital encounter of 07/10/24 (from the past 48 hours)  CBC with Differential     Status: Abnormal   Collection Time: 07/10/24  9:12 PM  Result Value Ref Range   WBC 7.8 4.0 - 10.5 K/uL   RBC 3.72 (L) 4.22 - 5.81 MIL/uL   Hemoglobin 11.9 (L) 13.0 - 17.0 g/dL   HCT 64.6 (L) 60.9 - 47.9 %   MCV 94.9 80.0 - 100.0 fL   MCH 32.0 26.0 - 34.0 pg   MCHC 33.7 30.0 - 36.0 g/dL   RDW 86.1 88.4 - 84.4 %   Platelets 220 150 - 400 K/uL   nRBC 0.0 0.0 - 0.2 %   Neutrophils Relative % 63 %  Neutro Abs 4.8 1.7 - 7.7 K/uL   Lymphocytes Relative 25 %   Lymphs Abs 2.0 0.7 - 4.0 K/uL   Monocytes Relative 10 %   Monocytes Absolute 0.8 0.1 - 1.0 K/uL   Eosinophils Relative 2 %   Eosinophils Absolute 0.2 0.0 - 0.5 K/uL   Basophils Relative 0 %   Basophils Absolute 0.0 0.0 - 0.1 K/uL   Immature Granulocytes 0 %   Abs Immature Granulocytes 0.03 0.00 - 0.07 K/uL    Comment: Performed at [facility], [address], [place], [place] [address]  Comprehensive metabolic panel     Status: Abnormal   Collection Time: 07/10/24  9:12 PM  Result Value Ref Range   Sodium 138 135 - 145 mmol/L   Potassium 4.2 3.5 - 5.1 mmol/L   Chloride 100 98 - 111 mmol/L   CO2 26 22 - 32 mmol/L   Glucose, Bld 146 (H) 70 - 99 mg/dL    Comment: Glucose reference range applies only to samples taken after fasting for at least 8 hours.   BUN 31 (H) 8 - 23 mg/dL   Creatinine, Ser  8.34 (H) 0.61 - 1.24 mg/dL   Calcium 8.7 (L) 8.9 - 10.3 mg/dL   Total Protein 7.6 6.5 - 8.1 g/dL   Albumin 3.6 3.5 - 5.0 g/dL   AST 20 15 - 41 U/L   ALT 14 0 - 44 U/L   Alkaline Phosphatase 85 38 - 126 U/L   Total Bilirubin 0.4 0.0 - 1.2 mg/dL   GFR, Estimated 41 (L) >60 mL/min    Comment: (NOTE) Calculated using the CKD-EPI Creatinine Equation (2021)    Anion gap 12 5 - 15    Comment: Performed at [facility], [address], [place], [place] [address]  Lactic acid, plasma     Status: None   Collection Time: 07/10/24  9:12 PM  Result Value Ref Range   Lactic Acid, Venous 0.9 0.5 - 1.9 mmol/L    Comment: Performed at [facility], [address] [place], [place] [address]  Culture, blood (routine x 2)     Status: None (Preliminary result)   Collection Time: 07/10/24  9:42 PM   Specimen: Blood  Result Value Ref Range   Specimen Description BLOOD RIGHT ANTECUBITAL    Special Requests      BOTTLES DRAWN AEROBIC AND ANAEROBIC Blood Culture adequate volume Performed at [facility], [address] [place], [place] [address]    Culture PENDING    Report Status PENDING   Culture, blood (routine x 2)     Status: None (Preliminary result)   Collection Time: 07/10/24  9:53 PM   Specimen: Blood  Result Value Ref Range   Specimen Description BLOOD LEFT ANTECUBITAL    Special Requests      BOTTLES DRAWN AEROBIC AND ANAEROBIC Blood Culture adequate volume Performed at [facility], [address], [place], [place] [address]    Culture PENDING    Report Status PENDING   CBG monitoring, ED     Status: Abnormal   Collection Time: 07/10/24 10:37 PM  Result Value Ref Range   Glucose-Capillary 148 (H) 70 - 99 mg/dL    Comment: Glucose reference range applies only to samples taken after fasting for at least 8 hours.   DG Foot Complete Left Result Date: 07/10/2024 EXAM: 3 or more VIEW(S) XRAY OF THE LEFT FOOT 07/10/2024 08:59:39 PM COMPARISON: None available. CLINICAL HISTORY: Toe pain. Pt  c/o wound with redness and  swelling to left great toe after cutting his toe nails last week. FINDINGS: BONES AND JOINTS: No acute fracture. No focal osseous lesion. No joint dislocation. Moderate plantar and posterior calcaneal enthesophytes. SOFT TISSUES: Mild soft tissue swelling along the dorsal forefoot. IMPRESSION: 1. No acute osseous abnormality. 2. Mild soft tissue swelling. Electronically signed by: Pinkie Pebbles MD 07/10/2024 09:06 PM EDT RP Workstation: [id number]    Pending Labs Unresulted Labs (From admission, onward)     Start     Ordered   07/17/24 0500  Creatinine, serum  (enoxaparin  (LOVENOX )    CrCl >/= 30 ml/min)  Weekly,   R     Comments: while on enoxaparin  therapy    07/10/24 2231   07/11/24 0500  Hemoglobin A1c  Tomorrow morning,   R        07/10/24 2231   07/11/24 0500  Sedimentation rate  Tomorrow morning,   R        07/10/24 2231   07/11/24 0500  C-reactive protein  Tomorrow morning,   R        07/10/24 2231   07/11/24 0500  Prealbumin  Tomorrow morning,   R        07/10/24 2231   07/11/24 0500  Basic metabolic panel  Daily,   R      07/10/24 2231   07/11/24 0500  CBC  Daily,   R      07/10/24 2231            Vitals/Pain Today's Vitals   07/10/24 2041 07/10/24 2300  BP: (!) 212/70 (!) 174/67  Pulse: 76 63  Resp: 18 17  Temp: 99.6 F (37.6 C)   TempSrc: Oral   SpO2: 98% 96%  Weight: 210 lb (95.3 kg)   Height: 6' (1.829 m)   PainSc: 3      Isolation Precautions No active isolations  Medications Medications  metoprolol  succinate (TOPROL -XL) 24 hr tablet 50 mg (50 mg Oral Given 07/10/24 2301)  linezolid  (ZYVOX ) tablet 600 mg (has no administration in time range)  enoxaparin  (LOVENOX ) injection 40 mg (has no administration in time range)  sodium chloride  flush (NS) 0.9 % injection 3 mL (3 mLs Intravenous Not Given 07/10/24 2233)  acetaminophen  (TYLENOL ) tablet 650 mg (has no administration in time range)    Or  acetaminophen  (TYLENOL )  suppository 650 mg (has no administration in time range)  oxyCODONE  (Oxy IR/ROXICODONE ) immediate release tablet 5 mg (has no administration in time range)  fentaNYL  (SUBLIMAZE ) injection 12.5-50 mcg (has no administration in time range)  senna (SENOKOT) tablet 8.6 mg (has no administration in time range)  prochlorperazine  (COMPAZINE ) injection 5 mg (has no administration in time range)  insulin  aspart (novoLOG ) injection 0-6 Units (has no administration in time range)  insulin  aspart (novoLOG ) injection 0-5 Units ( Subcutaneous Not Given 07/10/24 2238)  hydrALAZINE  (APRESOLINE ) tablet 50 mg (has no administration in time range)  losartan  (COZAAR ) tablet 100 mg (has no administration in time range)    And  hydrochlorothiazide  (HYDRODIURIL ) tablet 25 mg (has no administration in time range)  Ampicillin -Sulbactam (UNASYN ) 3 g in sodium chloride  0.9 % 100 mL IVPB (has no administration in time range)  vancomycin  (VANCOCIN ) IVPB 1000 mg/200 mL premix (0 mg Intravenous Stopped 07/10/24 2300)  piperacillin -tazobactam (ZOSYN ) IVPB 3.375 g (0 g Intravenous Stopped 07/10/24 2231)  sodium chloride  0.9 % bolus 1,000 mL (0 mLs Intravenous Stopped 07/10/24 2300)  acetaminophen  (TYLENOL ) tablet 650 mg (650 mg Oral Given 07/10/24 2157)  Mobility walks     Focused Assessments    R Recommendations: See Admitting Provider Note  Report given to:  Additional Notes:

## 2024-07-10 NOTE — H&P (Signed)
 History and Physical    George Jefferson FMW:[id number] DOB: 1941-06-03 DOA: 07/10/2024  PCP: Wells Ditch, MD   Patient coming from: Home   Chief Complaint: Left foot redness, swelling   HPI: George Jefferson is a 83 y.o. male with medical history significant for diet-controlled type 2 diabetes mellitus, hypertension, and CKD 3B who presents with worsening redness and swelling involving his left forefoot.  Patient reports that he may have cut his toenails too short last week when attempting to manage ingrown nails.  Since then, he has developed progressive redness, swelling, and blisters involving the 1st, 2nd, and 3rd toes and extending onto the dorsum of the left foot.  He denies any fevers, chills, chest pain, or shortness of breath.  ED Course: Upon arrival to the ED, patient is found to be afebrile and saturating well on room air with normal HR and elevated BP.  Labs are most notable for creatinine 1.65, normal lactate, and normal WBC.  Plain radiographs of the left foot demonstrate mild soft tissue swelling without acute osseous abnormality.  Blood cultures were collected and the patient was treated with vancomycin , Zosyn , 1 L NS, and acetaminophen  in the ED.  Review of Systems:  All other systems reviewed and apart from HPI, are negative.  Past Medical History:  Diagnosis Date   Arm fracture, right    x 2   Cancer (HCC)    skin cancer right nose   Diabetes mellitus without complication [facility])    patient stated borderline   Hypertension    Pneumonia    Stroke [facility]) 2006    Past Surgical History:  Procedure Laterality Date   BACK SURGERY     CHOLECYSTECTOMY     cyst removed     spine   HERNIA REPAIR     KNEE SURGERY Left    LUMBAR LAMINECTOMY/DECOMPRESSION MICRODISCECTOMY N/A 09/04/2022   Procedure: Sublaminar decompression Lumbar one-two;  Surgeon: Gillie Duncans, MD;  Location: [facility] OR;  Service: Neurosurgery;  Laterality: N/A;   NECK SURGERY     x 3 fusions   SKIN  CANCER EXCISION Right    nose   TONSILLECTOMY      Social History:   reports that he quit smoking about 18 years ago. His smoking use included cigarettes. He has never used smokeless tobacco. He reports that he does not drink alcohol and does not use drugs.  Allergies  Allergen Reactions   Ciprofloxacin     Liked to eat my stomach out.     Family History  Problem Relation Age of Onset   Diabetes Mother    Diabetes Sister    Diabetes Brother      Prior to Admission medications   Medication Sig Start Date End Date Taking? Authorizing Provider  aspirin 325 MG tablet Take 325 mg by mouth daily.    [provider]  Coenzyme Q10 (CO Q-10 PO) Take 1 capsule by mouth daily.    [provider]  folic acid  (FOLVITE ) 1 MG tablet Take 1 tablet by mouth daily. 04/06/17   [provider]  losartan -hydrochlorothiazide  (HYZAAR) 100-25 MG tablet Take 1 tablet by mouth daily. 06/19/22   [provider]  metoprolol  succinate (TOPROL -XL) 50 MG 24 hr tablet Take 50 mg by mouth 2 (two) times daily. 07/06/17   [provider]    Physical Exam: Vitals:   07/10/24 2041  BP: (!) 212/70  Pulse: 76  Resp: 18  Temp: 99.6 F (37.6 C)  TempSrc: Oral  SpO2: 98%  Weight: 95.3 kg  Height: 6' (1.829 m)    Constitutional: NAD, calm  Eyes: PERTLA, lids and conjunctivae normal ENMT: Mucous membranes are moist. Posterior pharynx clear of any exudate or lesions.   Neck: supple, no masses  Respiratory: no wheezing, no crackles. No accessory muscle use.  Cardiovascular: S1 & S2 heard, regular rate and rhythm. Mild lower extremity edema.   Abdomen: No tenderness, soft. Bowel sounds active.  Musculoskeletal: no clubbing / cyanosis. No joint deformity upper and lower extremities.   Skin: Erythema, heat, edema, and bullae involving left forefoot. Skin is otherwise warm, dry, well-perfused. Neurologic: CN 2-12 grossly intact. Moving all extremities. Alert and  oriented.  Psychiatric: Pleasant. Cooperative.    Labs and Imaging on Admission: I have personally reviewed following labs and imaging studies  CBC: Recent Labs  Lab 07/10/24 2112  WBC 7.8  NEUTROABS 4.8  HGB 11.9*  HCT 35.3*  MCV 94.9  PLT 220   Basic Metabolic Panel: Recent Labs  Lab 07/10/24 2112  NA 138  K 4.2  CL 100  CO2 26  GLUCOSE 146*  BUN 31*  CREATININE 1.65*  CALCIUM 8.7*   GFR: Estimated Creatinine Clearance: 42.1 mL/min (A) (by C-G formula based on SCr of 1.65 mg/dL (H)). Liver Function Tests: Recent Labs  Lab 07/10/24 2112  AST 20  ALT 14  ALKPHOS 85  BILITOT 0.4  PROT 7.6  ALBUMIN 3.6   No results for input(s): LIPASE, AMYLASE in the last 168 hours. No results for input(s): AMMONIA in the last 168 hours. Coagulation Profile: No results for input(s): INR, PROTIME in the last 168 hours. Cardiac Enzymes: No results for input(s): CKTOTAL, CKMB, CKMBINDEX, TROPONINI in the last 168 hours. BNP (last 3 results) No results for input(s): PROBNP in the last 8760 hours. HbA1C: No results for input(s): HGBA1C in the last 72 hours. CBG: No results for input(s): GLUCAP in the last 168 hours. Lipid Profile: No results for input(s): CHOL, HDL, LDLCALC, TRIG, CHOLHDL, LDLDIRECT in the last 72 hours. Thyroid Function Tests: No results for input(s): TSH, T4TOTAL, FREET4, T3FREE, THYROIDAB in the last 72 hours. Anemia Panel: No results for input(s): VITAMINB12, FOLATE, FERRITIN, TIBC, IRON, RETICCTPCT in the last 72 hours. Urine analysis:    Component Value Date/Time   COLORURINE YELLOW 06/26/2007 1425   APPEARANCEUR CLEAR 06/26/2007 1425   LABSPEC 1.023 06/26/2007 1425   PHURINE 7.0 06/26/2007 1425   GLUCOSEU NEGATIVE 06/26/2007 1425   HGBUR NEGATIVE 06/26/2007 1425   BILIRUBINUR NEGATIVE 06/26/2007 1425   KETONESUR NEGATIVE 06/26/2007 1425   PROTEINUR NEGATIVE 06/26/2007 1425    UROBILINOGEN 0.2 06/26/2007 1425   NITRITE NEGATIVE 06/26/2007 1425   LEUKOCYTESUR  06/26/2007 1425    NEGATIVE MICROSCOPIC NOT DONE ON URINES WITH NEGATIVE PROTEIN, BLOOD, LEUKOCYTES, NITRITE, OR GLUCOSE <1000 mg/dL.   Sepsis Labs: @LABRCNTIP (procalcitonin:4,lacticidven:4) )No results found for this or any previous visit (from the past 240 hours).   Radiological Exams on Admission: DG Foot Complete Left Result Date: 07/10/2024 EXAM: 3 or more VIEW(S) XRAY OF THE LEFT FOOT 07/10/2024 08:59:39 PM COMPARISON: None available. CLINICAL HISTORY: Toe pain. Pt c/o wound with redness and swelling to left great toe after cutting his toe nails last week. FINDINGS: BONES AND JOINTS: No acute fracture. No focal osseous lesion. No joint dislocation. Moderate plantar and posterior calcaneal enthesophytes. SOFT TISSUES: Mild soft tissue swelling along the dorsal forefoot. IMPRESSION: 1. No acute osseous abnormality. 2. Mild soft tissue swelling. Electronically signed by: Sriyesh Krishnan  MD 07/10/2024 09:06 PM EDT RP Workstation: [id number]    Assessment/Plan   1. Left foot cellulitis   - Check ABI, treat with Unasyn  and linezolid , obtain advanced imaging and/or surgical consultation if worsens or fails to improve as expected    2. Hypertensive urgency  - SBP severely elevated in ED without target-organ damage - Continue ARB, hydrochlorothiazide , and metoprolol , use hydralazine  as-needed    3. CKD 3B  - Appears close to baseline  - Renally-dose medications, monitor    4. Type II DM  - A1c was 7.0% in 2020; currently diet-controlled  - check CBGs and use low-intensity SSI if needed    DVT prophylaxis: Lovenox   Code Status: Full  Level of Care: Level of care: Med-Surg Family Communication: Daughter at bedside  Disposition Plan:  Patient is from: Home  Anticipated d/c is to: TBD Anticipated d/c date is: 07/14/24 Patient currently: pending ABI, clinical improvement Consults called: none   Admission status: Inpatient     Evalene GORMAN Sprinkles, MD [facility]  07/10/2024, 10:31 PM

## 2024-07-11 ENCOUNTER — Inpatient Hospital Stay (HOSPITAL_COMMUNITY)

## 2024-07-11 DIAGNOSIS — L03116 Cellulitis of left lower limb: Secondary | ICD-10-CM | POA: Diagnosis not present

## 2024-07-11 LAB — BASIC METABOLIC PANEL WITH GFR
Anion gap: 8 (ref 5–15)
BUN: 28 mg/dL — ABNORMAL HIGH (ref 8–23)
CO2: 25 mmol/L (ref 22–32)
Calcium: 8.2 mg/dL — ABNORMAL LOW (ref 8.9–10.3)
Chloride: 105 mmol/L (ref 98–111)
Creatinine, Ser: 1.52 mg/dL — ABNORMAL HIGH (ref 0.61–1.24)
GFR, Estimated: 46 mL/min — ABNORMAL LOW (ref >60)
Glucose, Bld: 113 mg/dL — ABNORMAL HIGH (ref 70–99)
Potassium: 4.6 mmol/L (ref 3.5–5.1)
Sodium: 138 mmol/L (ref 135–145)

## 2024-07-11 LAB — GLUCOSE, CAPILLARY
Glucose-Capillary: 102 mg/dL — ABNORMAL HIGH (ref 70–99)
Glucose-Capillary: 164 mg/dL — ABNORMAL HIGH (ref 70–99)
Glucose-Capillary: 223 mg/dL — ABNORMAL HIGH (ref 70–99)
Glucose-Capillary: 238 mg/dL — ABNORMAL HIGH (ref 70–99)
Glucose-Capillary: 84 mg/dL (ref 70–99)

## 2024-07-11 LAB — CBC
HCT: 33.5 % — ABNORMAL LOW (ref 39.0–52.0)
Hemoglobin: 11 g/dL — ABNORMAL LOW (ref 13.0–17.0)
MCH: 31.3 pg (ref 26.0–34.0)
MCHC: 32.8 g/dL (ref 30.0–36.0)
MCV: 95.4 fL (ref 80.0–100.0)
Platelets: 180 K/uL (ref 150–400)
RBC: 3.51 MIL/uL — ABNORMAL LOW (ref 4.22–5.81)
RDW: 13.7 % (ref 11.5–15.5)
WBC: 5.6 K/uL (ref 4.0–10.5)
nRBC: 0 % (ref 0.0–0.2)

## 2024-07-11 LAB — HEMOGLOBIN A1C
Hgb A1c MFr Bld: 6.2 % — ABNORMAL HIGH (ref 4.8–5.6)
Mean Plasma Glucose: 131.24 mg/dL

## 2024-07-11 LAB — SEDIMENTATION RATE: Sed Rate: 52 mm/h — ABNORMAL HIGH (ref 0–20)

## 2024-07-11 LAB — C-REACTIVE PROTEIN: CRP: 5.1 mg/dL — ABNORMAL HIGH (ref <1.0)

## 2024-07-11 LAB — PREALBUMIN: Prealbumin: 24 mg/dL (ref 18–38)

## 2024-07-11 MED ORDER — PROSOURCE PLUS PO LIQD
30.0000 mL | Freq: Every day | ORAL | Status: DC
  Administered 2024-07-12 – 2024-07-13 (×2): 30 mL via ORAL
  Filled 2024-07-11 (×2): qty 30

## 2024-07-11 MED ORDER — ADULT MULTIVITAMIN W/MINERALS CH
1.0000 | ORAL_TABLET | Freq: Every day | ORAL | Status: DC
  Administered 2024-07-11 – 2024-07-13 (×3): 1 via ORAL
  Filled 2024-07-11 (×3): qty 1

## 2024-07-11 MED ORDER — GLUCERNA SHAKE PO LIQD
237.0000 mL | ORAL | Status: DC
  Administered 2024-07-11 – 2024-07-12 (×2): 237 mL via ORAL

## 2024-07-11 MED ORDER — LABETALOL HCL 5 MG/ML IV SOLN: 10.0000 mg | INTRAVENOUS | Status: DC | PRN

## 2024-07-11 NOTE — Consult Note (Signed)
 ORTHOPAEDIC CONSULTATION  REQUESTING PHYSICIAN: Jillian Buttery, MD  ASSESSMENT AND PLAN: 83 y.o. male with the following: Left foot cellulitis  MRI of the left foot is without drainable fluid collection.  Blistering on the toes can be treated with local wound care.  At this time, there is no surgical intervention warranted.  Continue with antibiotics.  Orthopedics recommends admission to a medical service and we will provide consultation and follow along  - Weight Bearing Status/Activity: Weightbearing as tolerated  - Additional recommended labs/tests: None  -VTE Prophylaxis: As needed  - Pain control: As needed  - Follow-up plan: To be determined  -Procedures: None  Chief Complaint: Left foot pain  HPI: George Jefferson is a 83 y.o. male with past medical history as listed below, who presented the emergency department with a couple of days of swelling in redness to the left foot.  He reports that he tried to clip an ingrown toenail, and subsequently had issues with swelling.  Today it has progressively worsened.  He did not notice any blistering until he tried to complete a hot water soak on his left foot.  He denies fevers or chills.  He states he is not a diabetic.  Past Medical History:  Diagnosis Date   Arm fracture, right    x 2   Cancer (HCC)    skin cancer right nose   Diabetes mellitus without complication [facility])    patient stated borderline   Hypertension    Pneumonia    Stroke [facility]) 2006   Past Surgical History:  Procedure Laterality Date   BACK SURGERY     CHOLECYSTECTOMY     cyst removed     spine   HERNIA REPAIR     KNEE SURGERY Left    LUMBAR LAMINECTOMY/DECOMPRESSION MICRODISCECTOMY N/A 09/04/2022   Procedure: Sublaminar decompression Lumbar one-two;  Surgeon: Gillie Duncans, MD;  Location: [facility] OR;  Service: Neurosurgery;  Laterality: N/A;   NECK SURGERY     x 3 fusions   SKIN CANCER EXCISION Right    nose   TONSILLECTOMY     Social History    Socioeconomic History   Marital status: Widowed    Spouse name: Not on file   Number of children: 2   Years of education: Not on file   Highest education level: Not on file  Occupational History   Not on file  Tobacco Use   Smoking status: Former    Current packs/day: 0.00    Types: Cigarettes    Quit date: 12/21/2005    Years since quitting: 18.5   Smokeless tobacco: Never  Vaping Use   Vaping status: Never Used  Substance and Sexual Activity   Alcohol use: No   Drug use: No   Sexual activity: Not Currently  Other Topics Concern   Not on file  Social History Narrative   One daughter has passed   Social Drivers of Corporate investment banker Strain: Not on file  Food Insecurity: No Food Insecurity (07/10/2024)   Hunger Vital Sign    Worried About Running Out of Food in the Last Year: Never true    Ran Out of Food in the Last Year: Never true  Transportation Needs: No Transportation Needs (07/10/2024)   PRAPARE - Administrator, Civil Service (Medical): No    Lack of Transportation (Non-Medical): No  Physical Activity: Not on file  Stress: Not on file  Social Connections: Moderately Isolated (07/10/2024)   Social Connection and  Isolation Panel    Frequency of Communication with Friends and Family: Twice a week    Frequency of Social Gatherings with Friends and Family: Twice a week    Attends Religious Services: 1 to 4 times per year    Active Member of [employer] or Organizations: No    Attends Banker Meetings: Never    Marital Status: Widowed   Family History  Problem Relation Age of Onset   Diabetes Mother    Diabetes Sister    Diabetes Brother    Allergies  Allergen Reactions   Ciprofloxacin     Liked to eat my stomach out.    Prior to Admission medications   Medication Sig Start Date End Date Taking? Authorizing Provider  aspirin 325 MG tablet Take 325 mg by mouth daily.   Yes [provider]  folic acid  (FOLVITE ) 1 MG  tablet Take 1 tablet by mouth daily. 04/06/17  Yes [provider]  losartan -hydrochlorothiazide  (HYZAAR) 100-25 MG tablet Take 1 tablet by mouth daily. 06/19/22  Yes [provider]  metoprolol  succinate (TOPROL -XL) 50 MG 24 hr tablet Take 50 mg by mouth 2 (two) times daily. 07/06/17  Yes [provider]  Coenzyme Q10 (CO Q-10 PO) Take 1 capsule by mouth daily.    [provider]   DG Foot Complete Left Result Date: 07/10/2024 EXAM: 3 or more VIEW(S) XRAY OF THE LEFT FOOT 07/10/2024 08:59:39 PM COMPARISON: None available. CLINICAL HISTORY: Toe pain. Pt c/o wound with redness and swelling to left great toe after cutting his toe nails last week. FINDINGS: BONES AND JOINTS: No acute fracture. No focal osseous lesion. No joint dislocation. Moderate plantar and posterior calcaneal enthesophytes. SOFT TISSUES: Mild soft tissue swelling along the dorsal forefoot. IMPRESSION: 1. No acute osseous abnormality. 2. Mild soft tissue swelling. Electronically signed by: Pinkie Pebbles MD 07/10/2024 09:06 PM EDT RP Workstation: [id number]   Family History Reviewed and non-contributory, no pertinent history of problems with bleeding or anesthesia    Review of Systems No fevers or chills No numbness or tingling No chest pain No shortness of breath No bowel or bladder dysfunction No GI distress No headaches    OBJECTIVE  Vitals:Patient Vitals for the past 8 hrs:  BP Temp Temp src Pulse Resp SpO2  07/11/24 0808 (!) 189/72 -- -- 62 -- --  07/11/24 0603 (!) 175/72 (!) 97.5 F (36.4 C) Oral (!) 59 17 100 %   General: Alert, no acute distress Cardiovascular: Warm extremities noted Respiratory: No cyanosis, no use of accessory musculature GI: No organomegaly, abdomen is soft and non-tender Skin: No lesions in the area of chief complaint other than those listed below in MSK exam.  Neurologic: Sensation intact distally save for the below mentioned MSK exam Psychiatric:  Patient is competent for consent with normal mood and affect Lymphatic: No swelling obvious and reported other than the area involved in the exam below Extremities   LLE:             Test Results Imaging  X-rays are negative for bony destruction.  No acute injuries.  MRI left foot  MPRESSION: 1. Dorsal fluid signal favoring blistering along the distal forefoot and tracking into the first through fourth toes. 2. Nonspecific infiltrative dorsal subcutaneous edema in the forefoot. 3. No evidence of osteomyelitis. 4. Mild flexor hallucis longus tenosynovitis just proximal to the knot of Henry. 5. Moderate regional muscular atrophy.   Labs cbc Recent Labs    07/10/24 2112 07/11/24  0433  WBC 7.8 5.6  HGB 11.9* 11.0*  HCT 35.3* 33.5*  PLT 220 180      Recent Labs    07/10/24 2112 07/11/24 0433  NA 138 138  K 4.2 4.6  CL 100 105  CO2 26 25  GLUCOSE 146* 113*  BUN 31* 28*  CREATININE 1.65* 1.52*  CALCIUM 8.7* 8.2*

## 2024-07-11 NOTE — Progress Notes (Signed)
 PROGRESS NOTE  George Jefferson  FMW:[id number] DOB: 1940-12-23 DOA: 07/10/2024 PCP: Wells Ditch, MD   Brief Narrative: Patient is a 83 year old male with history of diet-controlled diabetes type 2, hypertension, CKD stage III who presented with worsening redness, swelling/pain involving the left forefoot.  He had a ingrowing toenail on the left foot and he tried to cut the toenail with cord culture resulting in progressive swelling, redness, blisters, ulcer on the left big toe.  No fever or chills at home.  On presentation he was afebrile but hypertensive.  Lab work showed creatinine of 1.6.  X-ray of the left foot showed mild soft tissue swelling without acute osseous abnormality.  Patient was started on broad spectrum antibiotics.  Plan to do MRI of the foot today.  Orthopedics also consulted.  Assessment & Plan:  Principal Problem:   Cellulitis of left foot Active Problems:   Hypertension   Diabetes mellitus without complication (HCC)   CKD stage 3b, GFR 30-44 ml/min (HCC)   Hypertensive urgency   Left foot cellulitis: History of diabetes type II.  He had a ingrowing toenail on the left foot and he tried to cut the toenail with cord culture resulting in progressive swelling, redness, blisters, ulcer on the left big toe.  X-ray did not show any osseous abnormality.  On examination, left foot had bulla, shallow ulcer on the big toe, edema, erythema. Patient says he feels better today. Will do an MRI of the left foot.  Orthopedics also consulted( Dr Onesimo).  Continue broad spectrum antibiotics for now.  Severe hypertension: Takes ARB, hydrochlorothiazide , metoprolol  at home.  Patient says his blood pressure  most of the time.  Possible noncompliance as well.  Continue monitor blood pressure, continue as needed medications for severe hypertension  Stage 3B CKD: Currently kidney function close to baseline  Type 2 diabetes: A1c of 7 as per 2020.  Continue sliding scale.  Monitor blood  sugars.New A1c pending        DVT prophylaxis:enoxaparin  (LOVENOX ) injection 40 mg Start: 07/11/24 1000     Code Status: Full Code  Family Communication: Wife at bedside on 7/22  Patient status:Inpatient  Patient is from :Home  Anticipated discharge un:Ynfz  Estimated DC date:2-3 days   Consultants: Orthopedics  Procedures:None  Antimicrobials:  Anti-infectives (From admission, onward)    Start     Dose/Rate Route Frequency Ordered Stop   07/11/24 1000  linezolid  (ZYVOX ) tablet 600 mg       Placed in And Linked Group   600 mg Oral Every 12 hours 07/10/24 2231 07/18/24 0959   07/11/24 0400  Ampicillin -Sulbactam (UNASYN ) 3 g in sodium chloride  0.9 % 100 mL IVPB        3 g 200 mL/hr over 30 Minutes Intravenous Every 6 hours 07/10/24 2246     07/10/24 2145  vancomycin  (VANCOCIN ) IVPB 1000 mg/200 mL premix        1,000 mg 200 mL/hr over 60 Minutes Intravenous  Once 07/10/24 2136 07/10/24 2300   07/10/24 2145  piperacillin -tazobactam (ZOSYN ) IVPB 3.375 g        3.375 g 100 mL/hr over 30 Minutes Intravenous  Once 07/10/24 2136 07/10/24 2231       Subjective: Patient seen and examined at the bedside today.  Hemodynamically stable.  Comfortably lying in bed.  He thinks that his left foot looks better today.  Less swelling and erythema.  No fever.  Hemodynamically stable.  Pain well-controlled  Objective: Vitals:   07/10/24 2041 07/10/24  2300 07/10/24 2329 07/11/24 0603  BP: (!) 212/70 (!) 174/67 (!) 182/69 (!) 175/72  Pulse: 76 63 64 (!) 59  Resp: 18 17 17 17   Temp: 99.6 F (37.6 C)  97.8 F (36.6 C) (!) 97.5 F (36.4 C)  TempSrc: Oral  Oral Oral  SpO2: 98% 96% 98% 100%  Weight: 95.3 kg     Height: 6' (1.829 m)       Intake/Output Summary (Last 24 hours) at 07/11/2024 0801 Last data filed at 07/10/2024 2300 Gross per 24 hour  Intake 1250 ml  Output --  Net 1250 ml   Filed Weights   07/10/24 2041  Weight: 95.3 kg    Examination:  General exam:  Overall comfortable, not in distress HEENT: PERRL Respiratory system:  no wheezes or crackles  Cardiovascular system: S1 & S2 heard, RRR.  Gastrointestinal system: Abdomen is nondistended, soft and nontender. Central nervous system: Alert and oriented Extremities: Erythema, edema, bulla of the right leg with shallow ulcer on the right big toe Skin: No rashes, no ulcers,no icterus     Data Reviewed: I have personally reviewed following labs and imaging studies  CBC: Recent Labs  Lab 07/10/24 2112 07/11/24 0433  WBC 7.8 5.6  NEUTROABS 4.8  --   HGB 11.9* 11.0*  HCT 35.3* 33.5*  MCV 94.9 95.4  PLT 220 180   Basic Metabolic Panel: Recent Labs  Lab 07/10/24 2112 07/11/24 0433  NA 138 138  K 4.2 4.6  CL 100 105  CO2 26 25  GLUCOSE 146* 113*  BUN 31* 28*  CREATININE 1.65* 1.52*  CALCIUM 8.7* 8.2*     Recent Results (from the past 240 hours)  Culture, blood (routine x 2)     Status: None (Preliminary result)   Collection Time: 07/10/24  9:42 PM   Specimen: Blood  Result Value Ref Range Status   Specimen Description BLOOD RIGHT ANTECUBITAL  Final   Special Requests   Final    BOTTLES DRAWN AEROBIC AND ANAEROBIC Blood Culture adequate volume Performed at [facility], [address] [place], [place] [address]    Culture PENDING  Incomplete   Report Status PENDING  Incomplete  Culture, blood (routine x 2)     Status: None (Preliminary result)   Collection Time: 07/10/24  9:53 PM   Specimen: Blood  Result Value Ref Range Status   Specimen Description BLOOD LEFT ANTECUBITAL  Final   Special Requests   Final    BOTTLES DRAWN AEROBIC AND ANAEROBIC Blood Culture adequate volume Performed at [facility], [address] [place], [place] [address]    Culture PENDING  Incomplete   Report Status PENDING  Incomplete     Radiology Studies: DG Foot Complete Left Result Date: 07/10/2024 EXAM: 3 or more VIEW(S) XRAY OF THE LEFT FOOT 07/10/2024 08:59:39 PM COMPARISON: None  available. CLINICAL HISTORY: Toe pain. Pt c/o wound with redness and swelling to left great toe after cutting his toe nails last week. FINDINGS: BONES AND JOINTS: No acute fracture. No focal osseous lesion. No joint dislocation. Moderate plantar and posterior calcaneal enthesophytes. SOFT TISSUES: Mild soft tissue swelling along the dorsal forefoot. IMPRESSION: 1. No acute osseous abnormality. 2. Mild soft tissue swelling. Electronically signed by: Pinkie Pebbles MD 07/10/2024 09:06 PM EDT RP Workstation: [id number]    Scheduled Meds:  enoxaparin  (LOVENOX ) injection  40 mg Subcutaneous Q24H   losartan   100 mg Oral Daily   And   hydrochlorothiazide   25 mg Oral Daily  insulin  aspart  0-5 Units Subcutaneous QHS   insulin  aspart  0-6 Units Subcutaneous TID WC   linezolid   600 mg Oral Q12H   metoprolol  succinate  50 mg Oral BID   sodium chloride  flush  3 mL Intravenous Q12H   Continuous Infusions:  ampicillin -sulbactam (UNASYN ) IV 3 g (07/11/24 0519)     LOS: 1 day   Ivonne Mustache, MD [facility] P7/22/2025, 8:01 AM

## 2024-07-11 NOTE — Progress Notes (Addendum)
   07/11/24 1057  TOC Brief Assessment  Insurance and Status Reviewed  Patient has primary care physician Yes  Home environment has been reviewed Home Alone  Prior level of function: independent  Prior/Current Home Services No current home services  Social Drivers of Health Review SDOH reviewed no interventions necessary  Readmission risk has been reviewed Yes  Transition of care needs no transition of care needs at this time   TOC following, patient lives alone, may have home health needs. Transition of Care Department [facility]) has reviewed patient and no TOC needs have been identified at this time. We will continue to monitor patient advancement through interdisciplinary progression rounds. If new patient transition needs arise, please place a TOC consult.

## 2024-07-11 NOTE — Progress Notes (Signed)
 Initial Nutrition Assessment  DOCUMENTATION CODES:   Not applicable  INTERVENTION:   Glucerna Shake po once daily, each supplement provides 220 kcal and 10 grams of protein. Prosource Plus 30 ml PO once daily, each packet provides 100 kcal and 15 gm protein. MVI with minerals daily.  NUTRITION DIAGNOSIS:   Increased nutrient needs related to wound healing as evidenced by estimated needs.  GOAL:   Patient will meet greater than or equal to 90% of their needs  MONITOR:   PO intake, Supplement acceptance  REASON FOR ASSESSMENT:   Consult Wound healing  ASSESSMENT:   83 yo male admitted with left foot cellulitis. PMH includes DM-2, HTN, CKD 3B, stroke, skin cancer.  Unable to speak with patient or complete NFPE at this time; patient is out of his room for a procedure.   Currently on a regular diet. Meal intakes not recorded.  Labs reviewed. Prealbumin 24 WNL, CRP 5.1 (H) CBG: 148-102  Medications reviewed and include hydrochlorothiazide , novolog , IV unasyn .  Weight history reviewed.  Current weight is 7% below usual weight from ~2 years ago.  NUTRITION - FOCUSED PHYSICAL EXAM:  Unable to complete, patient out of room for procedure  Diet Order:   Diet Order             Diet regular Room service appropriate? Yes; Fluid consistency: Thin  Diet effective now                   EDUCATION NEEDS:   Not appropriate for education at this time  Skin:  Skin Assessment: Skin Integrity Issues: Skin Integrity Issues:: Other (Comment) Other: L foot cellulitis  Last BM:  unknown  Height:   Ht Readings from Last 1 Encounters:  07/10/24 6' (1.829 m)    Weight:   Wt Readings from Last 1 Encounters:  07/10/24 95.3 kg    Ideal Body Weight:  80.9 kg  BMI:  Body mass index is 28.48 kg/m.  Estimated Nutritional Needs:   Kcal:  2200-2400  Protein:  120-140 gm  Fluid:  2.2-2.4 L   Suzen HUNT RD, LDN, CNSC Contact via secure chat. If unavailable,  use group chat RD Inpatient.

## 2024-07-11 NOTE — Progress Notes (Signed)
 PT Cancellation Note  Patient Details Name: George Jefferson MRN: [id number] DOB: 11-01-1941   Cancelled Treatment:    Reason Eval/Treat Not Completed: PT screened, no needs identified, will sign off Patient received using restroom upon arrival to PT evaluation. Daughter present during session. Patient exited restroom without use of AD, ambulated to sink to wash hands, and no unsteadiness or LOB noted. Patient reports no pain currently and reports L foot is not currently hindering his mobility. Patient reports and daughter confirms patient is quite active; currently building two decks. Patient does not present with urgent need for skilled physical therapy at this time.  Patient discharged to care of nursing for ambulation daily as tolerated for length of stay.    3:14 PM, 07/11/24 Rosaria Settler, PT, DPT [place] with [facility]

## 2024-07-12 DIAGNOSIS — L03116 Cellulitis of left lower limb: Secondary | ICD-10-CM | POA: Diagnosis not present

## 2024-07-12 LAB — BASIC METABOLIC PANEL WITH GFR
Anion gap: 11 (ref 5–15)
BUN: 23 mg/dL (ref 8–23)
CO2: 26 mmol/L (ref 22–32)
Calcium: 8.7 mg/dL — ABNORMAL LOW (ref 8.9–10.3)
Chloride: 103 mmol/L (ref 98–111)
Creatinine, Ser: 1.31 mg/dL — ABNORMAL HIGH (ref 0.61–1.24)
GFR, Estimated: 55 mL/min — ABNORMAL LOW (ref >60)
Glucose, Bld: 122 mg/dL — ABNORMAL HIGH (ref 70–99)
Potassium: 4.9 mmol/L (ref 3.5–5.1)
Sodium: 140 mmol/L (ref 135–145)

## 2024-07-12 LAB — CBC
HCT: 35.9 % — ABNORMAL LOW (ref 39.0–52.0)
Hemoglobin: 12.1 g/dL — ABNORMAL LOW (ref 13.0–17.0)
MCH: 31.8 pg (ref 26.0–34.0)
MCHC: 33.7 g/dL (ref 30.0–36.0)
MCV: 94.5 fL (ref 80.0–100.0)
Platelets: 208 K/uL (ref 150–400)
RBC: 3.8 MIL/uL — ABNORMAL LOW (ref 4.22–5.81)
RDW: 13.4 % (ref 11.5–15.5)
WBC: 7.2 K/uL (ref 4.0–10.5)
nRBC: 0 % (ref 0.0–0.2)

## 2024-07-12 LAB — GLUCOSE, CAPILLARY
Glucose-Capillary: 98 mg/dL (ref 70–99)
Glucose-Capillary: 176 mg/dL — ABNORMAL HIGH (ref 70–99)
Glucose-Capillary: 145 mg/dL — ABNORMAL HIGH (ref 70–99)
Glucose-Capillary: 192 mg/dL — ABNORMAL HIGH (ref 70–99)

## 2024-07-12 MED ORDER — AMLODIPINE BESYLATE 5 MG PO TABS
10.0000 mg | ORAL_TABLET | Freq: Every day | ORAL | Status: DC
  Administered 2024-07-12 – 2024-07-13 (×2): 10 mg via ORAL
  Filled 2024-07-12 (×2): qty 2

## 2024-07-12 NOTE — Consult Note (Addendum)
 WOC Nurse Consult Note: Reason for Consult: Consult requested for left foot wound. Performed remotely after review of progress notes and photos in the EMR.  Pt was seen by the ortho service; refer to their progress notes on 7/22; MRI of the left foot is without drainable fluid collection. Blistering on the toes can be treated with local wound care. At this time, there is no surgical intervention warranted. Continue with antibiotics.  Wound type: Left anterior and plantar foot and great toe and 2,3,4 toes with generalized edema and erythremia.  Anterior foot with yellow fluid filled blisters.  Expect these will rupture soon and evolve into full thickness tissue loss. Pt is currently on systemic antibiotics for cellulitis.  Dressing procedure/placement/frequency: Topical treatment orders provided for bedside nurses to perform as follows to promote drying and healing: Apply Xeroform gauze to left foot and toe wounds Q day, then cover with ABD pad and kerlex.  Please re-consult if further assistance is needed.  Thank-you,  Stephane Fought MSN, RN, CWOCN, CWCN-AP, CNS Contact Mon-Fri 0700-1500: [phone]

## 2024-07-12 NOTE — Progress Notes (Signed)
 PROGRESS NOTE  George Jefferson  FMW:[id number] DOB: 24-Oct-1941 DOA: 07/10/2024 PCP: Wells Ditch, MD   Brief Narrative: Patient is a 83 year old male with history of diet-controlled diabetes type 2, hypertension, CKD stage III who presented with worsening redness, swelling/pain involving the left forefoot.  He had a ingrowing toenail on the left foot and he tried to cut the toenail with cord culture resulting in progressive swelling, redness, blisters, ulcer on the left big toe.  No fever or chills at home.  On presentation he was afebrile but hypertensive.  Lab work showed creatinine of 1.6.  X-ray of the left foot showed mild soft tissue swelling without acute osseous abnormality.  Patient was started on broad spectrum antibiotics.    Orthopedics also consulted, recommended conservative management with antibiotics.  MRI without evidence of osteomyelitis.  Current plan is to continue few days of IV antibiotics and plan for discharge with oral antibiotics  Assessment & Plan:  Principal Problem:   Cellulitis of left foot Active Problems:   Hypertension   Diabetes mellitus without complication (HCC)   CKD stage 3b, GFR 30-44 ml/min (HCC)   Hypertensive urgency   Left foot cellulitis: History of diabetes type II.  He had a ingrowing toenail on the left foot and he tried to cut the toenail with cord culture resulting in progressive swelling, redness, blisters, ulcer on the left big toe.  X-ray did not show any osseous abnormality.  On examination, left foot had bulla, shallow ulcer on the big toe, edema, erythema.  MRI of the left foot did not show osteomyelitis, showed a subcutaneous edema, mild flexor hallucis longus tenosynovitis .  Orthopedics also consulted( Dr Onesimo).  Orthopedics not planning for intervention.  Foot is looking better.  Wound care consulted.  Continue broad spectrum antibiotics for now for next 24 to 48 hours.  Plan for discharge with oral antibiotics for that.  Severe  hypertension: Takes ARB, hydrochlorothiazide , metoprolol  at home.  Patient says his blood pressure  is up most of the time.  Possible noncompliance as well.  Continue monitor blood pressure, continue as needed medications for severe hypertension.  Add amlodipine , increase the dose of metoprolol   Stage 3B CKD: Currently kidney function close to baseline  Type 2 diabetes: Well controlled.New A1c of 6.2   Nutrition Problem: Increased nutrient needs Etiology: wound healing    DVT prophylaxis:enoxaparin  (LOVENOX ) injection 40 mg Start: 07/11/24 1000     Code Status: Full Code  Family Communication: Wife at bedside on 7/22  Patient status:Inpatient  Patient is from :Home  Anticipated discharge un:Ynfz  Estimated DC date:1-2 days   Consultants: Orthopedics  Procedures:None  Antimicrobials:  Anti-infectives (From admission, onward)    Start     Dose/Rate Route Frequency Ordered Stop   07/11/24 1000  linezolid  (ZYVOX ) tablet 600 mg       Placed in And Linked Group   600 mg Oral Every 12 hours 07/10/24 2231 07/18/24 0959   07/11/24 0400  Ampicillin -Sulbactam (UNASYN ) 3 g in sodium chloride  0.9 % 100 mL IVPB        3 g 200 mL/hr over 30 Minutes Intravenous Every 6 hours 07/10/24 2246     07/10/24 2145  vancomycin  (VANCOCIN ) IVPB 1000 mg/200 mL premix        1,000 mg 200 mL/hr over 60 Minutes Intravenous  Once 07/10/24 2136 07/10/24 2300   07/10/24 2145  piperacillin -tazobactam (ZOSYN ) IVPB 3.375 g        3.375 g 100 mL/hr over 30  Minutes Intravenous  Once 07/10/24 2136 07/10/24 2231       Subjective: Patient seen and examined at bedside today.  Hemodynamically stable.  Afebrile.  Lying in bed.  He feels better today.  Left foot looks better but he still has significant edema.  Erythema looks better  Objective: Vitals:   07/11/24 1331 07/11/24 2015 07/12/24 0546 07/12/24 0849  BP: (!) 173/64 (!) 186/70 (!) 175/72 (!) 191/70  Pulse: 61 71 63 63  Resp: 16 18 18    Temp:  97.7 F (36.5 C) 97.7 F (36.5 C) 97.7 F (36.5 C)   TempSrc: Oral Oral Oral   SpO2: 100% 99% 100%   Weight:      Height:        Intake/Output Summary (Last 24 hours) at 07/12/2024 1058 Last data filed at 07/11/2024 2130 Gross per 24 hour  Intake 720 ml  Output --  Net 720 ml   Filed Weights   07/10/24 2041  Weight: 95.3 kg    Examination:  General exam: Overall comfortable, not in distress HEENT: PERRL Respiratory system:  no wheezes or crackles  Cardiovascular system: S1 & S2 heard, RRR.  Gastrointestinal system: Abdomen is nondistended, soft and nontender. Central nervous system: Alert and oriented Extremities: No edema, no clubbing ,no cyanosis Extremities: Erythema, edema, bulla of the right leg with shallow ulcer on the right big toe, bluish discoloration of big toe   Data Reviewed: I have personally reviewed following labs and imaging studies  CBC: Recent Labs  Lab 07/10/24 2112 07/11/24 0433 07/12/24 0434  WBC 7.8 5.6 7.2  NEUTROABS 4.8  --   --   HGB 11.9* 11.0* 12.1*  HCT 35.3* 33.5* 35.9*  MCV 94.9 95.4 94.5  PLT 220 180 208   Basic Metabolic Panel: Recent Labs  Lab 07/10/24 2112 07/11/24 0433 07/12/24 0434  NA 138 138 140  K 4.2 4.6 4.9  CL 100 105 103  CO2 26 25 26   GLUCOSE 146* 113* 122*  BUN 31* 28* 23  CREATININE 1.65* 1.52* 1.31*  CALCIUM 8.7* 8.2* 8.7*     Recent Results (from the past 240 hours)  Culture, blood (routine x 2)     Status: None (Preliminary result)   Collection Time: 07/10/24  9:42 PM   Specimen: BLOOD  Result Value Ref Range Status   Specimen Description BLOOD RIGHT ANTECUBITAL  Final   Special Requests   Final    BOTTLES DRAWN AEROBIC AND ANAEROBIC Blood Culture adequate volume   Culture   Final    NO GROWTH 2 DAYS Performed at [facility], [address] [place], [place] [address]    Report Status PENDING  Incomplete  Culture, blood (routine x 2)     Status: None (Preliminary result)   Collection Time:  07/10/24  9:53 PM   Specimen: BLOOD  Result Value Ref Range Status   Specimen Description BLOOD LEFT ANTECUBITAL  Final   Special Requests   Final    BOTTLES DRAWN AEROBIC AND ANAEROBIC Blood Culture adequate volume   Culture   Final    NO GROWTH 2 DAYS Performed at [facility], [address], [place], [place] [address]    Report Status PENDING  Incomplete     Radiology Studies: US  ARTERIAL ABI (SCREENING LOWER EXTREMITY) Result Date: 07/11/2024 CLINICAL DATA:  Diabetes, hypertension, left foot infection and discoloration. EXAM: NONINVASIVE PHYSIOLOGIC VASCULAR STUDY OF BILATERAL LOWER EXTREMITIES TECHNIQUE: Evaluation of both lower extremities were performed at rest, including calculation of ankle-brachial  indices with single level Doppler, pressure and pulse volume recording. COMPARISON:  None Available. FINDINGS: Right ABI:  0.75 Left ABI:  0.40 Right Lower Extremity: Monophasic posterior tibial and dorsalis pedis waveforms. Left Lower Extremity: Monophasic posterior tibial and dorsalis pedis waveforms. < 0.5 Severe PAD IMPRESSION: Evidence of severe peripheral vascular disease of the left lower extremity and moderate peripheral vascular disease of the right lower extremity based on resting ankle-brachial indices. Electronically Signed   By: Marcey Moan M.D.   On: 07/11/2024 13:19   MR FOOT LEFT WO CONTRAST Result Date: 07/11/2024 CLINICAL DATA:  Left foot pain and wound EXAM: MRI OF THE LEFT FOOT WITHOUT CONTRAST TECHNIQUE: Multiplanar, multisequence MR imaging of the left foot from the midfoot through the toes was performed. No intravenous contrast was administered. COMPARISON:  Radiographs 07/10/2024 FINDINGS: Bones/Joint/Cartilage Unremarkable. No joint effusion or evidence of osteomyelitis in the forefoot. Ligaments Lisfranc ligament intact. Muscles and Tendons Moderate regional muscular atrophy. Mild flexor hallucis longus tenosynovitis just proximal to the knot of Henry. Soft tissues  Dorsal fluid signal favoring blistering along the distal forefoot and tracking into the first through fourth toes. Nonspecific infiltrative dorsal subcutaneous edema in the forefoot. IMPRESSION: 1. Dorsal fluid signal favoring blistering along the distal forefoot and tracking into the first through fourth toes. 2. Nonspecific infiltrative dorsal subcutaneous edema in the forefoot. 3. No evidence of osteomyelitis. 4. Mild flexor hallucis longus tenosynovitis just proximal to the knot of Henry. 5. Moderate regional muscular atrophy. Electronically Signed   By: Ryan Salvage M.D.   On: 07/11/2024 09:09   DG Foot Complete Left Result Date: 07/10/2024 EXAM: 3 or more VIEW(S) XRAY OF THE LEFT FOOT 07/10/2024 08:59:39 PM COMPARISON: None available. CLINICAL HISTORY: Toe pain. Pt c/o wound with redness and swelling to left great toe after cutting his toe nails last week. FINDINGS: BONES AND JOINTS: No acute fracture. No focal osseous lesion. No joint dislocation. Moderate plantar and posterior calcaneal enthesophytes. SOFT TISSUES: Mild soft tissue swelling along the dorsal forefoot. IMPRESSION: 1. No acute osseous abnormality. 2. Mild soft tissue swelling. Electronically signed by: Pinkie Pebbles MD 07/10/2024 09:06 PM EDT RP Workstation: [id number]    Scheduled Meds:  (feeding supplement) PROSource Plus  30 mL Oral Daily   amLODipine   10 mg Oral Daily   enoxaparin  (LOVENOX ) injection  40 mg Subcutaneous Q24H   feeding supplement (GLUCERNA SHAKE)  237 mL Oral Q24H   losartan   100 mg Oral Daily   And   hydrochlorothiazide   25 mg Oral Daily   insulin  aspart  0-5 Units Subcutaneous QHS   insulin  aspart  0-6 Units Subcutaneous TID WC   linezolid   600 mg Oral Q12H   metoprolol  succinate  50 mg Oral BID   multivitamin with minerals  1 tablet Oral Daily   sodium chloride  flush  3 mL Intravenous Q12H   Continuous Infusions:  ampicillin -sulbactam (UNASYN ) IV 3 g (07/12/24 1004)     LOS: 2 days    Ivonne Mustache, MD [facility] P7/23/2025, 10:58 AM

## 2024-07-12 NOTE — Plan of Care (Signed)

## 2024-07-13 ENCOUNTER — Telehealth (HOSPITAL_COMMUNITY): Payer: Self-pay | Admitting: Pharmacy Technician

## 2024-07-13 ENCOUNTER — Other Ambulatory Visit (HOSPITAL_COMMUNITY): Payer: Self-pay

## 2024-07-13 DIAGNOSIS — L03116 Cellulitis of left lower limb: Secondary | ICD-10-CM | POA: Diagnosis not present

## 2024-07-13 LAB — BASIC METABOLIC PANEL WITH GFR
Anion gap: 8 (ref 5–15)
BUN: 20 mg/dL (ref 8–23)
CO2: 25 mmol/L (ref 22–32)
Calcium: 8.6 mg/dL — ABNORMAL LOW (ref 8.9–10.3)
Chloride: 102 mmol/L (ref 98–111)
Creatinine, Ser: 1.45 mg/dL — ABNORMAL HIGH (ref 0.61–1.24)
GFR, Estimated: 48 mL/min — ABNORMAL LOW (ref >60)
Glucose, Bld: 128 mg/dL — ABNORMAL HIGH (ref 70–99)
Potassium: 4.7 mmol/L (ref 3.5–5.1)
Sodium: 135 mmol/L (ref 135–145)

## 2024-07-13 LAB — CBC
HCT: 37.3 % — ABNORMAL LOW (ref 39.0–52.0)
Hemoglobin: 12.5 g/dL — ABNORMAL LOW (ref 13.0–17.0)
MCH: 31.6 pg (ref 26.0–34.0)
MCHC: 33.5 g/dL (ref 30.0–36.0)
MCV: 94.2 fL (ref 80.0–100.0)
Platelets: 222 K/uL (ref 150–400)
RBC: 3.96 MIL/uL — ABNORMAL LOW (ref 4.22–5.81)
RDW: 13.3 % (ref 11.5–15.5)
WBC: 7.6 K/uL (ref 4.0–10.5)
nRBC: 0 % (ref 0.0–0.2)

## 2024-07-13 LAB — GLUCOSE, CAPILLARY
Glucose-Capillary: 141 mg/dL — ABNORMAL HIGH (ref 70–99)
Glucose-Capillary: 201 mg/dL — ABNORMAL HIGH (ref 70–99)

## 2024-07-13 MED ORDER — LOSARTAN POTASSIUM 50 MG PO TABS
100.0000 mg | ORAL_TABLET | Freq: Every day | ORAL | Status: DC
  Administered 2024-07-13: 100 mg via ORAL

## 2024-07-13 MED ORDER — HYDROCHLOROTHIAZIDE 25 MG PO TABS: 25.0000 mg | ORAL_TABLET | Freq: Every day | ORAL | Status: DC

## 2024-07-13 MED ORDER — LABETALOL HCL 200 MG PO TABS
200.0000 mg | ORAL_TABLET | Freq: Two times a day (BID) | ORAL | Status: DC
  Administered 2024-07-13: 200 mg via ORAL
  Filled 2024-07-13: qty 1

## 2024-07-13 MED ORDER — AMOXICILLIN-POT CLAVULANATE 875-125 MG PO TABS: 1.0000 | ORAL_TABLET | Freq: Two times a day (BID) | ORAL | 0 refills | Status: AC

## 2024-07-13 MED ORDER — DOXYCYCLINE HYCLATE 100 MG PO TABS: 100.0000 mg | ORAL_TABLET | Freq: Two times a day (BID) | ORAL | 0 refills | Status: AC

## 2024-07-13 MED ORDER — LOSARTAN POTASSIUM 100 MG PO TABS: 50.0000 mg | ORAL_TABLET | Freq: Every day | ORAL | 1 refills | Status: DC

## 2024-07-13 MED ORDER — LABETALOL HCL 200 MG PO TABS: 200.0000 mg | ORAL_TABLET | Freq: Two times a day (BID) | ORAL | 0 refills | Status: DC

## 2024-07-13 MED ORDER — AMLODIPINE BESYLATE 10 MG PO TABS: 10.0000 mg | ORAL_TABLET | Freq: Every day | ORAL | 1 refills | Status: DC

## 2024-07-13 MED ORDER — LACTINEX PO CHEW: 1.0000 | CHEWABLE_TABLET | Freq: Three times a day (TID) | ORAL | 0 refills | Status: AC

## 2024-07-13 NOTE — Telephone Encounter (Signed)
 Patient Product/process development scientist completed.    The patient is insured through [facility]. Patient has Medicare and is not eligible for a copay card, but may be able to apply for patient assistance or Medicare RX Payment Plan (Patient Must reach out to their plan, if eligible for payment plan), if available.    Ran test claim for linezolid  600 mg and the current 7 day co-pay is $69.47.  Ran test claim for amoxicillin -clavulante 875-125 mg and the current 7 day co-pay is $5.00.  This test claim was processed through [facility]- copay amounts may vary at other pharmacies due to pharmacy/plan contracts, or as the patient moves through the different stages of their insurance plan.     Reyes Sharps, CPHT Pharmacy Technician III Certified Patient Advocate [facility] Patient Advocate Team Direct Number: [phone]  Fax: [phone]

## 2024-07-13 NOTE — Care Management Important Message (Signed)
 Important Message  Patient Details  Name: George Jefferson MRN: [id number] Date of Birth: [birth year]   Important Message Given:  Yes - Medicare IM     George Jefferson George Jefferson 07/13/2024, 11:09 AM

## 2024-07-13 NOTE — Discharge Summary (Signed)
 Physician Discharge Summary   Patient: George Jefferson MRN: [id number] DOB: [birth year]  Admit date:     07/10/2024  Discharge date: 07/13/24  Discharge Physician: Adriana DELENA Grams   PCP: Wells Ditch, MD   Recommendations at discharge:  -Follow-up with the primary physician in 1 week -Continue current recommended antibiotics, blood pressure and diabetic medications medication may be titrated and changed by PCP for better blood pressure and glycemic control -Follow-up with the podiatrist ASAP -Maintain left lower extremity wound clean, with a wound dressing per instructions-Home health wound RN consulted - Strict carb modified diabetic diet and sugar control recommended - Follow-up with ophthalmologist twice a year  Discharge Diagnoses: Principal Problem:   Cellulitis of left foot Active Problems:   Hypertension   Diabetes mellitus without complication (HCC)   CKD stage 3b, GFR 30-44 ml/min (HCC)   Hypertensive urgency  Left foot cellulitis: History of diabetes type II.  He had a ingrowing toenail on the left foot and he tried to cut the toenail with cord culture resulting in progressive swelling, redness, blisters, ulcer on the left big toe.  X-ray did not show any osseous abnormality.  On examination, left foot had bulla, shallow ulcer on the big toe, edema, erythema.  MRI of the left foot did not show osteomyelitis, showed a subcutaneous edema, mild flexor hallucis longus tenosynovitis .  Orthopedics also consulted( Dr Onesimo).  Orthopedics not planning for intervention.  Foot is looking better.  Wound care consulted.   Was treated with IV antibiotics Unasyn  and Zyvox >>> switch to p.o. Augmentin  plus doxycycline  For total of 10 days of antibiotic treatment    Severe hypertension: Takes ARB, hydrochlorothiazide , metoprolol  at home.  Patient says his blood pressure  is up most of the time.  Possible noncompliance as well.  Continue monitor blood pressure, continue as needed  medications for severe hypertension.  - Amlodipine  was added, metoprolol  substituted to labetalol , HCTZ discontinued losartan  reduced from 100 to 50 mg daily - This regimen may be titrated and switch by PCP for better BP control   Stage 3B CKD: Currently kidney function close to baseline (cr. 1.45, BUN 20, GFR 48)   Type 2 diabetes: Well controlled.New A1c of 6.2     Nutrition Problem: Increased nutrient needs Etiology: wound healing         Consultants: Orthopedic team Procedures performed: MRI of left lower extremity, ABI Disposition: Home with home health Diet recommendation: Carb modified diabetic diet Discharge Diet Orders (From admission, onward)     Start     Ordered   07/13/24 0000  Diet - low sodium heart healthy        07/13/24 1056           Cardiac and Carb modified diet DISCHARGE MEDICATION: Allergies as of 07/13/2024       Reactions   Ciprofloxacin Other (See Comments)   Liked to eat my stomach out.    Keflex [cephalexin] Nausea Only        Medication List     STOP taking these medications    losartan -hydrochlorothiazide  100-25 MG tablet Commonly known as: HYZAAR   metoprolol  succinate 50 MG 24 hr tablet Commonly known as: TOPROL -XL       TAKE these medications    amLODipine  10 MG tablet Commonly known as: NORVASC  Take 1 tablet (10 mg total) by mouth daily. Start taking on: July 14, 2024   amoxicillin -clavulanate 875-125 MG tablet Commonly known as: AUGMENTIN  Take 1 tablet by mouth 2 (two)  times daily for 7 days.   aspirin 325 MG tablet Take 325 mg by mouth daily.   doxycycline  100 MG tablet Commonly known as: VIBRA -TABS Take 1 tablet (100 mg total) by mouth 2 (two) times daily for 10 days.   folic acid  1 MG tablet Commonly known as: FOLVITE  Take 1 tablet by mouth daily.   labetalol  200 MG tablet Commonly known as: NORMODYNE  Take 1 tablet (200 mg total) by mouth 2 (two) times daily.   lactobacillus acidophilus &  bulgar chewable tablet Chew 1 tablet by mouth 3 (three) times daily with meals for 10 days.   losartan  100 MG tablet Commonly known as: COZAAR  Take 0.5 tablets (50 mg total) by mouth daily. Start taking on: July 14, 2024               Discharge Care Instructions  (From admission, onward)           Start     Ordered   07/13/24 0000  Discharge wound care:       Comments: Wound care  Daily      Comments: Apply Xeroform gauze to left foot and toe wounds Q day, then cover with ABD pad and kerlex   07/13/24 1056            Discharge Exam: Filed Weights   07/10/24 2041  Weight: 95.3 kg        General:  AAO x 3,  cooperative, no distress;   HEENT:  Normocephalic, PERRL, otherwise with in Normal limits   Neuro:  CNII-XII intact. , normal motor and sensation, reflexes intact   Lungs:   Clear to auscultation BL, Respirations unlabored,  No wheezes / crackles  Cardio:    S1/S2, RRR, No murmure, No Rubs or Gallops   Abdomen:  Soft, non-tender, bowel sounds active all four quadrants, no guarding or peritoneal signs.  Muscular  skeletal:  Limited exam -global generalized weaknesses - in bed, able to move all 4 extremities,   2+ pulses,  symmetric, Left lower extremity-leg/foot erythema edema improving please see picture below  Skin:  Dry, warm to touch, negative for any Rashes,  Wounds: Erythema, edema, bulla of the leg with shallow ulcer on the right big toe, bluish discoloration of big toe          Condition at discharge: good  The results of significant diagnostics from this hospitalization (including imaging, microbiology, ancillary and laboratory) are listed below for reference.   Imaging Studies: US  ARTERIAL ABI (SCREENING LOWER EXTREMITY) Result Date: 07/11/2024 CLINICAL DATA:  Diabetes, hypertension, left foot infection and discoloration. EXAM: NONINVASIVE PHYSIOLOGIC VASCULAR STUDY OF BILATERAL LOWER EXTREMITIES TECHNIQUE: Evaluation of both lower  extremities were performed at rest, including calculation of ankle-brachial indices with single level Doppler, pressure and pulse volume recording. COMPARISON:  None Available. FINDINGS: Right ABI:  0.75 Left ABI:  0.40 Right Lower Extremity: Monophasic posterior tibial and dorsalis pedis waveforms. Left Lower Extremity: Monophasic posterior tibial and dorsalis pedis waveforms. < 0.5 Severe PAD IMPRESSION: Evidence of severe peripheral vascular disease of the left lower extremity and moderate peripheral vascular disease of the right lower extremity based on resting ankle-brachial indices. Electronically Signed   By: Marcey Moan M.D.   On: 07/11/2024 13:19   MR FOOT LEFT WO CONTRAST Result Date: 07/11/2024 CLINICAL DATA:  Left foot pain and wound EXAM: MRI OF THE LEFT FOOT WITHOUT CONTRAST TECHNIQUE: Multiplanar, multisequence MR imaging of the left foot from the midfoot through the toes was performed.  No intravenous contrast was administered. COMPARISON:  Radiographs 07/10/2024 FINDINGS: Bones/Joint/Cartilage Unremarkable. No joint effusion or evidence of osteomyelitis in the forefoot. Ligaments Lisfranc ligament intact. Muscles and Tendons Moderate regional muscular atrophy. Mild flexor hallucis longus tenosynovitis just proximal to the knot of Henry. Soft tissues Dorsal fluid signal favoring blistering along the distal forefoot and tracking into the first through fourth toes. Nonspecific infiltrative dorsal subcutaneous edema in the forefoot. IMPRESSION: 1. Dorsal fluid signal favoring blistering along the distal forefoot and tracking into the first through fourth toes. 2. Nonspecific infiltrative dorsal subcutaneous edema in the forefoot. 3. No evidence of osteomyelitis. 4. Mild flexor hallucis longus tenosynovitis just proximal to the knot of Henry. 5. Moderate regional muscular atrophy. Electronically Signed   By: Ryan Salvage M.D.   On: 07/11/2024 09:09   DG Foot Complete Left Result Date:  07/10/2024 EXAM: 3 or more VIEW(S) XRAY OF THE LEFT FOOT 07/10/2024 08:59:39 PM COMPARISON: None available. CLINICAL HISTORY: Toe pain. Pt c/o wound with redness and swelling to left great toe after cutting his toe nails last week. FINDINGS: BONES AND JOINTS: No acute fracture. No focal osseous lesion. No joint dislocation. Moderate plantar and posterior calcaneal enthesophytes. SOFT TISSUES: Mild soft tissue swelling along the dorsal forefoot. IMPRESSION: 1. No acute osseous abnormality. 2. Mild soft tissue swelling. Electronically signed by: Pinkie Pebbles MD 07/10/2024 09:06 PM EDT RP Workstation: [id number]    Microbiology: Results for orders placed or performed during the hospital encounter of 07/10/24  Culture, blood (routine x 2)     Status: None (Preliminary result)   Collection Time: 07/10/24  9:42 PM   Specimen: BLOOD  Result Value Ref Range Status   Specimen Description BLOOD RIGHT ANTECUBITAL  Final   Special Requests   Final    BOTTLES DRAWN AEROBIC AND ANAEROBIC Blood Culture adequate volume   Culture   Final    NO GROWTH 3 DAYS Performed at [facility], [address], [place], [place] [address]    Report Status PENDING  Incomplete  Culture, blood (routine x 2)     Status: None (Preliminary result)   Collection Time: 07/10/24  9:53 PM   Specimen: BLOOD  Result Value Ref Range Status   Specimen Description BLOOD LEFT ANTECUBITAL  Final   Special Requests   Final    BOTTLES DRAWN AEROBIC AND ANAEROBIC Blood Culture adequate volume   Culture   Final    NO GROWTH 3 DAYS Performed at [facility], [address], [place], [place] [address]    Report Status PENDING  Incomplete    Labs: CBC: Recent Labs  Lab 07/10/24 2112 07/11/24 0433 07/12/24 0434 07/13/24 0443  WBC 7.8 5.6 7.2 7.6  NEUTROABS 4.8  --   --   --   HGB 11.9* 11.0* 12.1* 12.5*  HCT 35.3* 33.5* 35.9* 37.3*  MCV 94.9 95.4 94.5 94.2  PLT 220 180 208 222   Basic Metabolic Panel: Recent Labs  Lab  07/10/24 2112 07/11/24 0433 07/12/24 0434 07/13/24 0443  NA 138 138 140 135  K 4.2 4.6 4.9 4.7  CL 100 105 103 102  CO2 26 25 26 25   GLUCOSE 146* 113* 122* 128*  BUN 31* 28* 23 20  CREATININE 1.65* 1.52* 1.31* 1.45*  CALCIUM 8.7* 8.2* 8.7* 8.6*   Liver Function Tests: Recent Labs  Lab 07/10/24 2112  AST 20  ALT 14  ALKPHOS 85  BILITOT 0.4  PROT 7.6  ALBUMIN 3.6   CBG: Recent Labs  Lab  07/12/24 0718 07/12/24 1120 07/12/24 1610 07/12/24 2107 07/13/24 0728  GLUCAP 98 176* 145* 192* 141*    Discharge time spent: greater than 30 minutes.  Signed: Adriana DELENA Grams, MD [facility] 07/13/2024

## 2024-07-15 LAB — CULTURE, BLOOD (ROUTINE X 2)
Culture: NO GROWTH
Culture: NO GROWTH
Special Requests: ADEQUATE
Special Requests: ADEQUATE

## 2024-08-01 ENCOUNTER — Ambulatory Visit: Admitting: Podiatry

## 2024-08-01 VITALS — BP 181/71 | HR 68 | Ht 72.0 in | Wt 210.0 lb

## 2024-08-01 DIAGNOSIS — L97529 Non-pressure chronic ulcer of other part of left foot with unspecified severity: Secondary | ICD-10-CM | POA: Diagnosis not present

## 2024-08-01 DIAGNOSIS — E11621 Type 2 diabetes mellitus with foot ulcer: Secondary | ICD-10-CM | POA: Diagnosis not present

## 2024-08-01 DIAGNOSIS — I739 Peripheral vascular disease, unspecified: Secondary | ICD-10-CM | POA: Diagnosis not present

## 2024-08-01 DIAGNOSIS — L03116 Cellulitis of left lower limb: Secondary | ICD-10-CM | POA: Diagnosis not present

## 2024-08-02 NOTE — Progress Notes (Signed)
 Subjective:  Patient ID: George Jefferson, male    DOB: 28-Jul-1941,  MRN: [id number]  Chief Complaint  Patient presents with   Nail Problem    Rm 3 new patient-left foot lost third toe Terrance Grams, MD refer. Possible necrosis of the left 1st-4th toes.    Discussed the use of AI scribe software for clinical note transcription with the patient, who gave verbal consent to proceed.  History of Present Illness George Jefferson is an 83 year old male who presents with gangrenous changes of multiple toes.  He has experienced red and blistered toes for about three weeks. Initially, he soaked them in warm salted water and applied peroxide, which led to blistering. He suspects the condition may have started from an ingrown toenail or an injury from dropping something on his foot. He was hospitalized for three and a half days and treated with antibiotics.  He has a history of borderline diabetes, with his A1c usually under seven, and he quit smoking in 2006. He reports that his toes have started to hurt. He wears a surgical shoe and has been using a sock, although he avoids it when there is drainage.  He has a history of falling from a three-story building, resulting in multiple back surgeries, which he believes have affected his feet. He experiences tingling in his feet and notes that fluid builds up when he walks or his foot dangles. No current pain in his toenails and the length is okay.      Objective:    Physical Exam VASCULAR: DP and PT pulse nonpalpable. Foot is warm and the midfoot DERMATOLOGIC: Normal skin turgor, texture, and temperature.  Gangrenous changes in toes NEUROLOGIC: Normal sensation to light touch and pressure. No paresthesias on examination. ORTHOPEDIC:  No pain to palpation. EXTREMITIES: Gangrenous changes in multiple toes, worst in left hallux and third toe, second toe poorly demarcated. Swelling and redness present in toes.        Results LABS A1c:  6.8  Procedure: Wound culture Description: Culture obtained from drainage of toes   Assessment:   1. Cellulitis of left foot   2. 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  Assessment and Plan Assessment & Plan Burn and gangrene of right toes Third degree burn with secondary cellulitis and gangrenous changes in multiple toes, particularly affecting the left hallux and third toe. The second toe is poorly demarcated. The condition is in the setting of peripheral arterial disease (PAD) and diabetes. The burn likely resulted from soaking the foot in overly warm water. The gangrenous changes are severe and may require partial amputation if healing does not progress. The healing process is expected to be prolonged, similar to frostbite, and may take several months. There is a risk of losing some toes if the condition does not improve. The current state does not appear to be badly infected, but there is still some swelling and redness present. - Take a culture of the drainage from the toes.  Antibiotic therapy pending results of culture data - Apply Betadine or iodine daily to the affected areas to dry out drainage. - Use a surgical shoe for offloading. - Avoid wearing a sock immediately after applying Betadine; use gauze if there is significant drainage. - Monitor for signs of worsening infection, such as increased redness, swelling, fever, or systemic symptoms, and go to the emergency room if these  occur. - Await culture results to determine the need for antibiotics. - Reevaluate in four to five weeks to assess healing progress.  Peripheral vascular disease Peripheral vascular disease (PVD) is contributing to the poor circulation and delayed healing of the burn and gangrenous changes in the toes. He has borderline diabetes, which may also be affecting circulation and healing. Significant trauma to the feet  from a fall may be contributing to the current circulation issues. - Follow up with the vascular team on August 08, 2024, to assess circulation and determine further management.      Return in about 4 weeks (around 08/29/2024) for wound care.

## 2024-08-04 ENCOUNTER — Encounter: Payer: Self-pay | Admitting: Podiatry

## 2024-08-05 LAB — WOUND CULTURE
MICRO NUMBER:: 16819902
SPECIMEN QUALITY:: ADEQUATE

## 2024-08-08 ENCOUNTER — Ambulatory Visit (INDEPENDENT_AMBULATORY_CARE_PROVIDER_SITE_OTHER): Admitting: Vascular Surgery

## 2024-08-08 ENCOUNTER — Encounter: Payer: Self-pay | Admitting: Vascular Surgery

## 2024-08-08 VITALS — BP 194/77 | HR 59 | Ht 72.0 in | Wt 210.0 lb

## 2024-08-08 DIAGNOSIS — I70262 Atherosclerosis of native arteries of extremities with gangrene, left leg: Secondary | ICD-10-CM

## 2024-08-08 NOTE — Progress Notes (Signed)
 [facility] [place]  ASSESSMENT / PLAN: George Jefferson is a 83 y.o. male with atherosclerosis of native arteries of left lower extremity causing gangrene.  Recommend:  Abstinence from all tobacco products. Blood glucose control with goal A1c < 7%. Blood pressure control with goal blood pressure < 130/80 mmHg. Lipid reduction therapy with goal LDL-C < 55 mg/dL. Aspirin  81mg  by mouth daily. Atorvastatin 40-80mg  PO QD (or other high intensity statin therapy).  Plan left lower extremity angiogram with possible intervention via right common femoral approach in cath lab as soon as schedule allows.   CHIEF COMPLAINT: gangrene  HISTORY OF PRESENT ILLNESS: George Jefferson is a 83 y.o. male referred to clinic for evaluation of gangrenous skin changes in the left 1st through 4th toe.  This initially began as a infection type picture about 6 to 8 weeks ago.  The toes became blistered and erythematous.  The toes have since turned gangrenous.  The patient's daughter is with him and reports fairly stable appearance of the skin changes over the past several weeks.  The patient has history of spinal surgery and has some sensory changes to the lower extremities along with paresthesias.  He reports no pain in the left foot, with the caveat that his sensation is not perfectly normal.  Prior to this episode, he does report some antecedent claudication.  No ischemic rest pain.   Past Medical History:  Diagnosis Date   Arm fracture, right    x 2   Cancer (HCC)    skin cancer right nose   Diabetes mellitus without complication [facility])    patient stated borderline   Hypertension    Pneumonia    Stroke [facility]) 2006    Past Surgical History:  Procedure Laterality Date   BACK SURGERY     CHOLECYSTECTOMY     cyst removed     spine   HERNIA REPAIR     KNEE SURGERY Left    LUMBAR LAMINECTOMY/DECOMPRESSION MICRODISCECTOMY N/A 09/04/2022   Procedure: Sublaminar decompression Lumbar  one-two;  Surgeon: Gillie Duncans, MD;  Location: [facility] OR;  Service: Neurosurgery;  Laterality: N/A;   NECK SURGERY     x 3 fusions   SKIN CANCER EXCISION Right    nose   TONSILLECTOMY      Family History  Problem Relation Age of Onset   Diabetes Mother    Diabetes Sister    Diabetes Brother     Social History   Socioeconomic History   Marital status: Widowed    Spouse name: Not on file   Number of children: 2   Years of education: Not on file   Highest education level: Not on file  Occupational History   Not on file  Tobacco Use   Smoking status: Former    Current packs/day: 0.00    Types: Cigarettes    Quit date: 12/21/2005    Years since quitting: 18.6   Smokeless tobacco: Never  Vaping Use   Vaping status: Never Used  Substance and Sexual Activity   Alcohol use: No   Drug use: No   Sexual activity: Not Currently  Other Topics Concern   Not on file  Social History Narrative   One daughter has passed   Social Drivers of Corporate investment banker Strain: Not on file  Food Insecurity: No Food Insecurity (07/10/2024)   Hunger Vital Sign    Worried About Running Out of Food in the Last Year: Never true  Ran Out of Food in the Last Year: Never true  Transportation Needs: No Transportation Needs (07/10/2024)   PRAPARE - Administrator, Civil Service (Medical): No    Lack of Transportation (Non-Medical): No  Physical Activity: Not on file  Stress: Not on file  Social Connections: Moderately Isolated (07/10/2024)   Social Connection and Isolation Panel    Frequency of Communication with Friends and Family: Twice a week    Frequency of Social Gatherings with Friends and Family: Twice a week    Attends Religious Services: 1 to 4 times per year    Active Member of [employer] or Organizations: No    Attends Banker Meetings: Never    Marital Status: Widowed  Intimate Partner Violence: Not At Risk (07/10/2024)   Humiliation, Afraid, Rape, and Kick  questionnaire    Fear of Current or Ex-Partner: No    Emotionally Abused: No    Physically Abused: No    Sexually Abused: No    Allergies  Allergen Reactions   Ciprofloxacin Other (See Comments)    Liked to eat my stomach out.    Keflex [Cephalexin] Nausea Only    Current Outpatient Medications  Medication Sig Dispense Refill   aspirin  325 MG tablet Take 325 mg by mouth daily.     folic acid  (FOLVITE ) 1 MG tablet Take 1 tablet by mouth daily.     losartan  (COZAAR ) 100 MG tablet Take 0.5 tablets (50 mg total) by mouth daily. 30 tablet 1   metoprolol  tartrate (LOPRESSOR ) 50 MG tablet Take 50 mg by mouth 2 (two) times daily.     No current facility-administered medications for this visit.    PHYSICAL EXAM Vitals:   08/08/24 1307  BP: (!) 194/77  Pulse: (!) 59  SpO2: 97%  Weight: 210 lb (95.3 kg)  Height: 6' (1.829 m)   Elderly man in no distress Regular rate and rhythm Unlabored breathing 1+ femoral pulses bilaterally No palpable pedal pulses Gangrenous skin changes of the toes of the left foot #1 through 4.   PERTINENT LABORATORY AND RADIOLOGIC DATA  Most recent CBC    Latest Ref Rng & Units 07/13/2024    4:43 AM 07/12/2024    4:34 AM 07/11/2024    4:33 AM  CBC  WBC 4.0 - 10.5 K/uL 7.6  7.2  5.6   Hemoglobin 13.0 - 17.0 g/dL 87.4  87.8  88.9   Hematocrit 39.0 - 52.0 % 37.3  35.9  33.5   Platelets 150 - 400 K/uL 222  208  180      Most recent CMP    Latest Ref Rng & Units 07/13/2024    4:43 AM 07/12/2024    4:34 AM 07/11/2024    4:33 AM  CMP  Glucose 70 - 99 mg/dL 871  877  886   BUN 8 - 23 mg/dL 20  23  28    Creatinine 0.61 - 1.24 mg/dL 8.54  8.68  8.47   Sodium 135 - 145 mmol/L 135  140  138   Potassium 3.5 - 5.1 mmol/L 4.7  4.9  4.6   Chloride 98 - 111 mmol/L 102  103  105   CO2 22 - 32 mmol/L 25  26  25    Calcium  8.9 - 10.3 mg/dL 8.6  8.7  8.2     Renal function CrCl cannot be calculated (Patient's most recent lab result is older than the maximum  21 days allowed.).  Hgb A1c MFr Bld (%)  Date Value  07/10/2024 6.2 (H)   CLINICAL DATA:  Diabetes, hypertension, left foot infection and discoloration.   EXAM: NONINVASIVE PHYSIOLOGIC VASCULAR STUDY OF BILATERAL LOWER EXTREMITIES   TECHNIQUE: Evaluation of both lower extremities were performed at rest, including calculation of ankle-brachial indices with single level Doppler, pressure and pulse volume recording.   COMPARISON:  None Available.   FINDINGS: Right ABI:  0.75   Left ABI:  0.40   Right Lower Extremity: Monophasic posterior tibial and dorsalis pedis waveforms.   Left Lower Extremity: Monophasic posterior tibial and dorsalis pedis waveforms.   < 0.5 Severe PAD   IMPRESSION: Evidence of severe peripheral vascular disease of the left lower extremity and moderate peripheral vascular disease of the right lower extremity based on resting ankle-brachial indices.  Debby SAILOR. Magda, MD FACS [facility] Phone Number: [phone] 08/08/2024 1:09 PM   Total time spent on preparing this encounter including chart review, data review, collecting history, examining the patient, and coordinating care: 60 minutes  Portions of this report may have been transcribed using voice recognition software.  Every effort has been made to ensure accuracy; however, inadvertent computerized transcription errors may still be present.

## 2024-08-08 NOTE — H&P (View-Only) (Signed)
 [facility] [place]  ASSESSMENT / PLAN: George Jefferson is a 83 y.o. male with atherosclerosis of native arteries of left lower extremity causing gangrene.  Recommend:  Abstinence from all tobacco products. Blood glucose control with goal A1c < 7%. Blood pressure control with goal blood pressure < 130/80 mmHg. Lipid reduction therapy with goal LDL-C < 55 mg/dL. Aspirin  81mg  by mouth daily. Atorvastatin 40-80mg  PO QD (or other high intensity statin therapy).  Plan left lower extremity angiogram with possible intervention via right common femoral approach in cath lab as soon as schedule allows.   CHIEF COMPLAINT: gangrene  HISTORY OF PRESENT ILLNESS: George Jefferson is a 83 y.o. male referred to clinic for evaluation of gangrenous skin changes in the left 1st through 4th toe.  This initially began as a infection type picture about 6 to 8 weeks ago.  The toes became blistered and erythematous.  The toes have since turned gangrenous.  The patient's daughter is with him and reports fairly stable appearance of the skin changes over the past several weeks.  The patient has history of spinal surgery and has some sensory changes to the lower extremities along with paresthesias.  He reports no pain in the left foot, with the caveat that his sensation is not perfectly normal.  Prior to this episode, he does report some antecedent claudication.  No ischemic rest pain.   Past Medical History:  Diagnosis Date   Arm fracture, right    x 2   Cancer (HCC)    skin cancer right nose   Diabetes mellitus without complication [facility])    patient stated borderline   Hypertension    Pneumonia    Stroke [facility]) 2006    Past Surgical History:  Procedure Laterality Date   BACK SURGERY     CHOLECYSTECTOMY     cyst removed     spine   HERNIA REPAIR     KNEE SURGERY Left    LUMBAR LAMINECTOMY/DECOMPRESSION MICRODISCECTOMY N/A 09/04/2022   Procedure: Sublaminar decompression Lumbar  one-two;  Surgeon: Gillie Duncans, MD;  Location: [facility] OR;  Service: Neurosurgery;  Laterality: N/A;   NECK SURGERY     x 3 fusions   SKIN CANCER EXCISION Right    nose   TONSILLECTOMY      Family History  Problem Relation Age of Onset   Diabetes Mother    Diabetes Sister    Diabetes Brother     Social History   Socioeconomic History   Marital status: Widowed    Spouse name: Not on file   Number of children: 2   Years of education: Not on file   Highest education level: Not on file  Occupational History   Not on file  Tobacco Use   Smoking status: Former    Current packs/day: 0.00    Types: Cigarettes    Quit date: 12/21/2005    Years since quitting: 18.6   Smokeless tobacco: Never  Vaping Use   Vaping status: Never Used  Substance and Sexual Activity   Alcohol use: No   Drug use: No   Sexual activity: Not Currently  Other Topics Concern   Not on file  Social History Narrative   One daughter has passed   Social Drivers of Corporate investment banker Strain: Not on file  Food Insecurity: No Food Insecurity (07/10/2024)   Hunger Vital Sign    Worried About Running Out of Food in the Last Year: Never true  Ran Out of Food in the Last Year: Never true  Transportation Needs: No Transportation Needs (07/10/2024)   PRAPARE - Administrator, Civil Service (Medical): No    Lack of Transportation (Non-Medical): No  Physical Activity: Not on file  Stress: Not on file  Social Connections: Moderately Isolated (07/10/2024)   Social Connection and Isolation Panel    Frequency of Communication with Friends and Family: Twice a week    Frequency of Social Gatherings with Friends and Family: Twice a week    Attends Religious Services: 1 to 4 times per year    Active Member of [employer] or Organizations: No    Attends Banker Meetings: Never    Marital Status: Widowed  Intimate Partner Violence: Not At Risk (07/10/2024)   Humiliation, Afraid, Rape, and Kick  questionnaire    Fear of Current or Ex-Partner: No    Emotionally Abused: No    Physically Abused: No    Sexually Abused: No    Allergies  Allergen Reactions   Ciprofloxacin Other (See Comments)    Liked to eat my stomach out.    Keflex [Cephalexin] Nausea Only    Current Outpatient Medications  Medication Sig Dispense Refill   aspirin  325 MG tablet Take 325 mg by mouth daily.     folic acid  (FOLVITE ) 1 MG tablet Take 1 tablet by mouth daily.     losartan  (COZAAR ) 100 MG tablet Take 0.5 tablets (50 mg total) by mouth daily. 30 tablet 1   metoprolol  tartrate (LOPRESSOR ) 50 MG tablet Take 50 mg by mouth 2 (two) times daily.     No current facility-administered medications for this visit.    PHYSICAL EXAM Vitals:   08/08/24 1307  BP: (!) 194/77  Pulse: (!) 59  SpO2: 97%  Weight: 210 lb (95.3 kg)  Height: 6' (1.829 m)   Elderly man in no distress Regular rate and rhythm Unlabored breathing 1+ femoral pulses bilaterally No palpable pedal pulses Gangrenous skin changes of the toes of the left foot #1 through 4.   PERTINENT LABORATORY AND RADIOLOGIC DATA  Most recent CBC    Latest Ref Rng & Units 07/13/2024    4:43 AM 07/12/2024    4:34 AM 07/11/2024    4:33 AM  CBC  WBC 4.0 - 10.5 K/uL 7.6  7.2  5.6   Hemoglobin 13.0 - 17.0 g/dL 87.4  87.8  88.9   Hematocrit 39.0 - 52.0 % 37.3  35.9  33.5   Platelets 150 - 400 K/uL 222  208  180      Most recent CMP    Latest Ref Rng & Units 07/13/2024    4:43 AM 07/12/2024    4:34 AM 07/11/2024    4:33 AM  CMP  Glucose 70 - 99 mg/dL 871  877  886   BUN 8 - 23 mg/dL 20  23  28    Creatinine 0.61 - 1.24 mg/dL 8.54  8.68  8.47   Sodium 135 - 145 mmol/L 135  140  138   Potassium 3.5 - 5.1 mmol/L 4.7  4.9  4.6   Chloride 98 - 111 mmol/L 102  103  105   CO2 22 - 32 mmol/L 25  26  25    Calcium  8.9 - 10.3 mg/dL 8.6  8.7  8.2     Renal function CrCl cannot be calculated (Patient's most recent lab result is older than the maximum  21 days allowed.).  Hgb A1c MFr Bld (%)  Date Value  07/10/2024 6.2 (H)   CLINICAL DATA:  Diabetes, hypertension, left foot infection and discoloration.   EXAM: NONINVASIVE PHYSIOLOGIC VASCULAR STUDY OF BILATERAL LOWER EXTREMITIES   TECHNIQUE: Evaluation of both lower extremities were performed at rest, including calculation of ankle-brachial indices with single level Doppler, pressure and pulse volume recording.   COMPARISON:  None Available.   FINDINGS: Right ABI:  0.75   Left ABI:  0.40   Right Lower Extremity: Monophasic posterior tibial and dorsalis pedis waveforms.   Left Lower Extremity: Monophasic posterior tibial and dorsalis pedis waveforms.   < 0.5 Severe PAD   IMPRESSION: Evidence of severe peripheral vascular disease of the left lower extremity and moderate peripheral vascular disease of the right lower extremity based on resting ankle-brachial indices.  Debby SAILOR. Magda, MD FACS [facility] Phone Number: [phone] 08/08/2024 1:09 PM   Total time spent on preparing this encounter including chart review, data review, collecting history, examining the patient, and coordinating care: 60 minutes  Portions of this report may have been transcribed using voice recognition software.  Every effort has been made to ensure accuracy; however, inadvertent computerized transcription errors may still be present.

## 2024-08-09 ENCOUNTER — Other Ambulatory Visit: Payer: Self-pay

## 2024-08-09 DIAGNOSIS — I70262 Atherosclerosis of native arteries of extremities with gangrene, left leg: Secondary | ICD-10-CM

## 2024-08-11 ENCOUNTER — Ambulatory Visit (HOSPITAL_COMMUNITY)
Admission: RE | Admit: 2024-08-11 | Discharge: 2024-08-11 | Disposition: A | Discharge status: Home / Self Care | Attending: Vascular Surgery | Admitting: Vascular Surgery

## 2024-08-11 ENCOUNTER — Encounter (HOSPITAL_COMMUNITY)
Admission: RE | Disposition: A | Discharge status: Home / Self Care | Payer: Self-pay | Source: Home / Self Care | Attending: Vascular Surgery

## 2024-08-11 ENCOUNTER — Other Ambulatory Visit: Payer: Self-pay

## 2024-08-11 DIAGNOSIS — E1152 Type 2 diabetes mellitus with diabetic peripheral angiopathy with gangrene: Secondary | ICD-10-CM | POA: Insufficient documentation

## 2024-08-11 DIAGNOSIS — I10 Essential (primary) hypertension: Secondary | ICD-10-CM | POA: Insufficient documentation

## 2024-08-11 DIAGNOSIS — Z87891 Personal history of nicotine dependence: Secondary | ICD-10-CM | POA: Insufficient documentation

## 2024-08-11 DIAGNOSIS — Z7902 Long term (current) use of antithrombotics/antiplatelets: Secondary | ICD-10-CM | POA: Insufficient documentation

## 2024-08-11 DIAGNOSIS — Z7982 Long term (current) use of aspirin: Secondary | ICD-10-CM | POA: Insufficient documentation

## 2024-08-11 DIAGNOSIS — I70201 Unspecified atherosclerosis of native arteries of extremities, right leg: Secondary | ICD-10-CM

## 2024-08-11 DIAGNOSIS — Z79899 Other long term (current) drug therapy: Secondary | ICD-10-CM | POA: Insufficient documentation

## 2024-08-11 DIAGNOSIS — I70262 Atherosclerosis of native arteries of extremities with gangrene, left leg: Secondary | ICD-10-CM | POA: Insufficient documentation

## 2024-08-11 DIAGNOSIS — I7777 Dissection of artery of lower extremity: Secondary | ICD-10-CM | POA: Diagnosis not present

## 2024-08-11 HISTORY — PX: ABDOMINAL AORTOGRAM: CATH118222

## 2024-08-11 HISTORY — PX: LOWER EXTREMITY INTERVENTION: CATH118252

## 2024-08-11 HISTORY — PX: LOWER EXTREMITY ANGIOGRAPHY: CATH118251

## 2024-08-11 LAB — GLUCOSE, CAPILLARY: Glucose-Capillary: 110 mg/dL — ABNORMAL HIGH (ref 70–99)

## 2024-08-11 LAB — POCT I-STAT, CHEM 8
BUN: 45 mg/dL — ABNORMAL HIGH (ref 8–23)
Calcium, Ion: 1.21 mmol/L (ref 1.15–1.40)
Chloride: 99 mmol/L (ref 98–111)
Creatinine, Ser: 1.9 mg/dL — ABNORMAL HIGH (ref 0.61–1.24)
Glucose, Bld: 107 mg/dL — ABNORMAL HIGH (ref 70–99)
HCT: 35 % — ABNORMAL LOW (ref 39.0–52.0)
Hemoglobin: 11.9 g/dL — ABNORMAL LOW (ref 13.0–17.0)
Potassium: 4.2 mmol/L (ref 3.5–5.1)
Sodium: 139 mmol/L (ref 135–145)
TCO2: 28 mmol/L (ref 22–32)

## 2024-08-11 SURGERY — ABDOMINAL AORTOGRAM
Anesthesia: LOCAL

## 2024-08-11 MED ORDER — HYDRALAZINE HCL 20 MG/ML IJ SOLN
INTRAMUSCULAR | Status: AC
  Filled 2024-08-11: qty 1

## 2024-08-11 MED ORDER — HEPARIN SODIUM (PORCINE) 1000 UNIT/ML IJ SOLN
INTRAMUSCULAR | Status: DC | PRN
  Administered 2024-08-11: 10000 [IU] via INTRAVENOUS

## 2024-08-11 MED ORDER — ROSUVASTATIN CALCIUM 20 MG PO TABS: 20.0000 mg | ORAL_TABLET | Freq: Every day | ORAL | 11 refills | Status: DC

## 2024-08-11 MED ORDER — CLOPIDOGREL BISULFATE 75 MG PO TABS: 300.0000 mg | ORAL_TABLET | Freq: Once | ORAL | Status: DC

## 2024-08-11 MED ORDER — HEPARIN (PORCINE) IN NACL 1000-0.9 UT/500ML-% IV SOLN
INTRAVENOUS | Status: DC | PRN
  Administered 2024-08-11 (×2): 500 mL

## 2024-08-11 MED ORDER — SODIUM CHLORIDE 0.9 % WEIGHT BASED INFUSION: 1.0000 mL/kg/h | INTRAVENOUS | Status: DC

## 2024-08-11 MED ORDER — LABETALOL HCL 5 MG/ML IV SOLN: 10.0000 mg | INTRAVENOUS | Status: DC | PRN

## 2024-08-11 MED ORDER — HYDRALAZINE HCL 20 MG/ML IJ SOLN
INTRAMUSCULAR | Status: DC | PRN
  Administered 2024-08-11 (×2): 10 mg via INTRAVENOUS

## 2024-08-11 MED ORDER — ROSUVASTATIN CALCIUM 20 MG PO TABS: 20.0000 mg | ORAL_TABLET | Freq: Every day | ORAL | Status: DC

## 2024-08-11 MED ORDER — MIDAZOLAM HCL 2 MG/2ML IJ SOLN
INTRAMUSCULAR | Status: DC | PRN
Start: 2024-08-11 — End: 2024-08-11
  Administered 2024-08-11: 1 mg via INTRAVENOUS

## 2024-08-11 MED ORDER — CLOPIDOGREL BISULFATE 300 MG PO TABS
ORAL_TABLET | ORAL | Status: DC | PRN
  Administered 2024-08-11: 300 mg via ORAL

## 2024-08-11 MED ORDER — FENTANYL CITRATE (PF) 100 MCG/2ML IJ SOLN
INTRAMUSCULAR | Status: AC
  Filled 2024-08-11: qty 2

## 2024-08-11 MED ORDER — CLOPIDOGREL BISULFATE 75 MG PO TABS: 75.0000 mg | ORAL_TABLET | Freq: Every day | ORAL | Status: DC

## 2024-08-11 MED ORDER — SODIUM CHLORIDE 0.9% FLUSH: 3.0000 mL | Freq: Two times a day (BID) | INTRAVENOUS | Status: DC

## 2024-08-11 MED ORDER — SODIUM CHLORIDE 0.9% FLUSH
3.0000 mL | INTRAVENOUS | Status: DC | PRN
Start: 2024-08-11 — End: 2024-08-11

## 2024-08-11 MED ORDER — MIDAZOLAM HCL 2 MG/2ML IJ SOLN
INTRAMUSCULAR | Status: AC
  Filled 2024-08-11: qty 2

## 2024-08-11 MED ORDER — ACETAMINOPHEN 325 MG PO TABS: 650.0000 mg | ORAL_TABLET | ORAL | Status: DC | PRN

## 2024-08-11 MED ORDER — CLOPIDOGREL BISULFATE 300 MG PO TABS
ORAL_TABLET | ORAL | Status: AC
  Filled 2024-08-11: qty 1

## 2024-08-11 MED ORDER — HYDRALAZINE HCL 20 MG/ML IJ SOLN: 5.0000 mg | INTRAMUSCULAR | Status: DC | PRN

## 2024-08-11 MED ORDER — ONDANSETRON HCL 4 MG/2ML IJ SOLN
4.0000 mg | Freq: Four times a day (QID) | INTRAMUSCULAR | Status: DC | PRN
Start: 2024-08-11 — End: 2024-08-11

## 2024-08-11 MED ORDER — LIDOCAINE HCL (PF) 1 % IJ SOLN
INTRAMUSCULAR | Status: DC | PRN
Start: 2024-08-11 — End: 2024-08-11
  Administered 2024-08-11: 5 mL via INTRADERMAL

## 2024-08-11 MED ORDER — FENTANYL CITRATE (PF) 100 MCG/2ML IJ SOLN
INTRAMUSCULAR | Status: DC | PRN
  Administered 2024-08-11: 25 ug via INTRAVENOUS
  Administered 2024-08-11: 50 ug via INTRAVENOUS

## 2024-08-11 MED ORDER — IODIXANOL 320 MG/ML IV SOLN
INTRAVENOUS | Status: DC | PRN
  Administered 2024-08-11: 60 mL via INTRA_ARTERIAL

## 2024-08-11 MED ORDER — LIDOCAINE HCL (PF) 1 % IJ SOLN
INTRAMUSCULAR | Status: AC
  Filled 2024-08-11: qty 30

## 2024-08-11 MED ORDER — ASPIRIN 81 MG PO TBEC: 81.0000 mg | DELAYED_RELEASE_TABLET | Freq: Every day | ORAL | Status: DC

## 2024-08-11 MED ORDER — SODIUM CHLORIDE 0.9 % IV SOLN: INTRAVENOUS | Status: DC

## 2024-08-11 MED ORDER — CLOPIDOGREL BISULFATE 75 MG PO TABS: 75.0000 mg | ORAL_TABLET | Freq: Every day | ORAL | 11 refills | Status: DC

## 2024-08-11 MED ORDER — SODIUM CHLORIDE 0.9 % IV SOLN: 250.0000 mL | INTRAVENOUS | Status: DC | PRN

## 2024-08-11 MED ORDER — HEPARIN SODIUM (PORCINE) 1000 UNIT/ML IJ SOLN
INTRAMUSCULAR | Status: AC
  Filled 2024-08-11: qty 10

## 2024-08-11 SURGICAL SUPPLY — 23 items
BALLOON MUSTANG 5X150X135 (BALLOONS) IMPLANT
BALLOON MUSTANG 7X60X135 (BALLOONS) IMPLANT
BALLOON MUSTANG 7X80X135 (BALLOONS) IMPLANT
CATH OMNI FLUSH 5F 65CM (CATHETERS) IMPLANT
CATH QUICKCROSS SUPP .035X90CM (MICROCATHETER) IMPLANT
CLOSURE PERCLOSE PROSTYLE (VASCULAR PRODUCTS) IMPLANT
COVER DOME SNAP 22 D (MISCELLANEOUS) IMPLANT
GLIDEWIRE ADV .035X260CM (WIRE) IMPLANT
KIT ENCORE 26 ADVANTAGE (KITS) IMPLANT
KIT MICROPUNCTURE NIT STIFF (SHEATH) IMPLANT
KIT PV (KITS) ×1 IMPLANT
KIT SINGLE USE MANIFOLD (KITS) IMPLANT
SET ATX-X65L (MISCELLANEOUS) IMPLANT
SHEATH CATAPULT 6FR 45 (SHEATH) IMPLANT
SHEATH PINNACLE 5F 10CM (SHEATH) IMPLANT
SHEATH PROBE COVER 6X72 (BAG) IMPLANT
STENT ELUVIA 6X120X130 (Permanent Stent) IMPLANT
STENT ELUVIA 6X150X130 (Permanent Stent) IMPLANT
STENT ELUVIA 6X80X130 (Permanent Stent) IMPLANT
STENT ZILVER PTX 8X100 (Permanent Stent) IMPLANT
STENT ZILVER PTX 8X60 (Permanent Stent) IMPLANT
STENT ZILVER PTX 8X80 (Permanent Stent) IMPLANT
WIRE BENTSON .035X145CM (WIRE) IMPLANT

## 2024-08-11 NOTE — Progress Notes (Signed)
 Patient and daughter was given discharge instructions. Both verbalized understanding.

## 2024-08-11 NOTE — Progress Notes (Signed)
 Patient ambulated to BR groin unremarkable.

## 2024-08-11 NOTE — Op Note (Signed)
 DATE OF SERVICE: 08/11/2024  PATIENT:  George Jefferson  83 y.o. male  PRE-OPERATIVE DIAGNOSIS:  Atherosclerosis of native arteries of left lower extremity causing gangrene  POST-OPERATIVE DIAGNOSIS:  Same  PROCEDURE:   1) Ultrasound guided right common femoral artery access (CPT [phone]) 2) Aortogram (CPT [phone]) 3) Left lower extremity angiogram with second order cannulation  (CPT [phone]) 4) Additional left lower extremity angiogram with third order cannulation (CPT [phone]) 5) Left femoropopliteal angioplasty and stenting - Eluvia 6x150x2;6x120;6x79mm (CPT 37226) 6) Left external iliac angioplasty and stenting - Zilver 8x84mm (CPT 37221) 7 Right external iliac angioplasty and stenting - Zilver 8x21mm (CPT 37221) 8) Conscious sedation (65 minutes) (CPT 99152)  SURGEON:  Debby SAILOR. Magda, MD  ASSISTANT: none  ANESTHESIA:   local and IV sedation  ESTIMATED BLOOD LOSS: min  LOCAL MEDICATIONS USED:  LIDOCAINE    COUNTS: confirmed correct.  PATIENT DISPOSITION:  PACU - hemodynamically stable.   Delay start of Pharmacological VTE agent (>24hrs) due to surgical blood loss or risk of bleeding: no  INDICATION FOR PROCEDURE: LEVII HAIRFIELD is a 83 y.o. male with left forefoot gangrene. He has non-invasive evidence of PAD. After careful discussion of risks, benefits, and alternatives the patient was offered angiogram. The patient understood and wished to proceed.  OPERATIVE FINDINGS:   Left renal artery: patent Right renal artery: patent  Infrarenal aorta: patent  Left common iliac artery: patent Right common iliac artery: patent  Left internal iliac artery: patent Right internal iliac artery: patent  Left external iliac artery: 70% stenosis in proximal artery extending into CIA bifurcation. Right external iliac artery:  70% stenosis in proximal artery extending into CIA bifurcation; non-flow limiting dissection below this.  Left common femoral artery: patent Right common femoral artery:  not studied  Left profunda femoris artery: severe ostial stenosis (70%); remainder of artery with normal patency and branching Right profunda femoris artery: not studied  Left superficial femoral artery: underfilled and diminutive at origin; occluded in mid thigh Right superficial femoral artery: not studied  Left popliteal artery: reconstitutes above the knee; patent behind and below the knee Right popliteal artery: not studied  Left anterior tibial artery: dominant tibial, courses to foot Right anterior tibial artery: not studied  Left tibioperoneal trunk: patent Right tibioperoneal trunk: not studied  Left peroneal artery: patent Right peroneal artery: not studied  Left posterior tibial artery: occluded Right posterior tibial artery: not studied  Left pedal circulation: disadvantaged Right pedal circulation: not studied   DESCRIPTION OF PROCEDURE: After identification of the patient in the pre-operative holding area, the patient was transferred to the operating room. The patient was positioned supine on the operating room table. Anesthesia was induced. The groins was prepped and draped in standard fashion. A surgical pause was performed confirming correct patient, procedure, and operative location.  The right groin was anesthetized with subcutaneous injection of 1% lidocaine . Using ultrasound guidance, the right common femoral artery was accessed with micropuncture technique. Fluoroscopy was used to confirm cannulation over the femoral head. The 49F micropuncture sheath was upsized to 43F.   A Benson wire was advanced into the distal aorta. Over the wire an omni flush catheter was advanced to the level of L2. Aortogram was performed - see above for details.   The left common iliac artery was selected with an omniflush catheter and glidewire guidewire. The wire was advanced into the common femoral artery. Over the wire the omni flush catheter was advanced into the external iliac artery. Selective  angiography  was performed - see above for details.   The decision was made to intervene. The patient was heparinized with 10,000 units of heparin . The 97F sheath was exchanged for a 76F x 45cm sheath. Selective angiography of the left lower extremity extremity performed prior to intervention from the left popliteal artery to confirm accurate crossing of the lesion.   The lesions were treated with: Left femoropopliteal angioplasty and stenting - Eluvia 6x150x2;6x120;6x44mm Left external iliac angioplasty and stenting - Zilver 8x78mm  Right external iliac angioplasty and stenting - Zilver 8x63mm  Completion angiography revealed:  Resolution of left FP and EIA stenosis Resolution of right EIA stenosis  A perclose was used to close the arteriotomy. Hemostasis was excellent upon completion.   Conscious sedation was administered with the use of IV fentanyl  and midazolam  under continuous physician and nurse monitoring.  Heart rate, blood pressure, and oxygen saturation were continuously monitored.  Total sedation time was 65 minutes  Upon completion of the case instrument and sharps counts were confirmed correct. The patient was transferred to the PACU in good condition. I was present for all portions of the procedure.  PLAN: ASA / Plavix  / Statin. Follow up with me in 4 weeks with ABI and LLE duplex. Likely to need forefoot amputation. Defer this to Dr. Silva.  Debby SAILOR. Magda, MD [facility] Phone Number: [phone] 08/11/2024 12:24 PM

## 2024-08-11 NOTE — Interval H&P Note (Signed)
 History and Physical Interval Note:  08/11/2024 12:23 PM  George Jefferson  has presented today for surgery, with the diagnosis of atherosclerosis left lower extremity with grangren.  The various methods of treatment have been discussed with the patient and family. After consideration of risks, benefits and other options for treatment, the patient has consented to  Procedure(s): ABDOMINAL AORTOGRAM (N/A) Lower Extremity Angiography (N/A) LOWER EXTREMITY INTERVENTION (N/A) as a surgical intervention.  The patient's history has been reviewed, patient examined, no change in status, stable for surgery.  I have reviewed the patient's chart and labs.  Questions were answered to the patient's satisfaction.     Debby LOISE Robertson

## 2024-08-12 ENCOUNTER — Encounter: Payer: Self-pay | Admitting: Vascular Surgery

## 2024-08-14 ENCOUNTER — Encounter (HOSPITAL_COMMUNITY): Payer: Self-pay | Admitting: Vascular Surgery

## 2024-08-14 ENCOUNTER — Other Ambulatory Visit: Payer: Self-pay | Admitting: Podiatry

## 2024-08-14 ENCOUNTER — Encounter: Payer: Self-pay | Admitting: Podiatry

## 2024-08-14 MED ORDER — GENTAMICIN SULFATE 0.1 % EX OINT: 1.0000 | TOPICAL_OINTMENT | Freq: Every day | CUTANEOUS | 0 refills | Status: AC

## 2024-08-16 ENCOUNTER — Other Ambulatory Visit: Payer: Self-pay

## 2024-08-16 DIAGNOSIS — I70262 Atherosclerosis of native arteries of extremities with gangrene, left leg: Secondary | ICD-10-CM

## 2024-08-23 ENCOUNTER — Encounter: Payer: Self-pay | Admitting: Podiatry

## 2024-08-29 ENCOUNTER — Ambulatory Visit (INDEPENDENT_AMBULATORY_CARE_PROVIDER_SITE_OTHER): Admitting: Podiatry

## 2024-08-29 VITALS — Ht 72.0 in | Wt 214.0 lb

## 2024-08-29 DIAGNOSIS — I96 Gangrene, not elsewhere classified: Secondary | ICD-10-CM

## 2024-08-30 ENCOUNTER — Telehealth: Payer: Self-pay | Admitting: Podiatry

## 2024-08-30 ENCOUNTER — Encounter: Payer: Self-pay | Admitting: Podiatry

## 2024-08-30 NOTE — Telephone Encounter (Signed)
 Received surgical consent form  Called pt and advised surgery date is 10/13 and he asked me to contact daughter and give her the information.  I called pts daughter and she is aware surgery is 10/13 at [facility].  Pt does take 81  aspirin  daily and I told her to have him stop 1 wk prior. Pt is also on Plavix . I will send letter to pts cardiologist.   Pharmacy is correct in chart.

## 2024-08-30 NOTE — Progress Notes (Signed)
  Subjective:  Patient ID: George Jefferson, male    DOB: March 15, 1941,  MRN: [id number]  Chief Complaint  Patient presents with   Cellulitis    Rm 1 Cellulitis of left foot. Redness and swelling in left foot. Necrosis of the 1st-3rd left toes.    Discussed the use of AI scribe software for clinical note transcription with the patient, who gave verbal consent to proceed.  History of Present Illness George Jefferson is an 83 year old male who presents with gangrenous changes of multiple toes.  He returns for follow-up and is doing fairly well, they have noticed less drainage      Objective:    Physical Exam VASCULAR: DP and PT pulse nonpalpable. Foot is warm and the midfoot DERMATOLOGIC: Normal skin turgor, texture, and temperature.  Gangrenous changes in toes NEUROLOGIC: Normal sensation to light touch and pressure. No paresthesias on examination. ORTHOPEDIC:  No pain to palpation. EXTREMITIES: Gangrenous changes are now well-demarcated on the distal tip of the left hallux majority of the second toe and distal third toe        Results LABS A1c: 6.8  Procedure: Wound culture Description: Prior culture on 08/14/2024 showed Pseudomonas   Assessment:   1. Gangrene of toe of left foot (HCC)      Plan:  Patient was evaluated and treated and all questions answered.  Assessment and Plan Assessment & Plan Burn and gangrene of right toes Showing some demarcation now.  We discussed the recovery process as well some of his soft tissue has recovered the demarcated areas are not going to recover completely.  We discussed treatment of this.  They are hesitant with amputation I discussed with them we can begin with operative debridement to see if there Sheree is a viable tissue beneath the deep areas.  For any areas that do not have any viable tissue or exposed bone is present then they will need partial amputation which the patient and his daughter are both understanding of.  We  discussed the risk-benefit symptoms complications including the risk of further nonhealing.  New culture was taken today and he will continue gentamicin  ointment and Betadine.  Follow-up with me after surgery for debridement and or partial amputations of the 1st, 2nd and 3rd toes left foot    Surgical plan:  Procedure: - Left foot wound remains and possible partial amputations of the 1st, 2nd and 3rd toes on the left foot  Location: - [facility]  Anesthesia plan: - Sedation with regional block  Postoperative pain plan: - Tylenol  1000 mg every 6 hours,oxycodone  5 mg 1-2 tabs every 6 hours only as needed  DVT prophylaxis: - None required  WB Restrictions / DME needs: - WBAT in surgical shoe postop    No follow-ups on file.

## 2024-09-01 ENCOUNTER — Telehealth: Payer: Self-pay | Admitting: Podiatry

## 2024-09-01 ENCOUNTER — Encounter: Payer: Self-pay | Admitting: Podiatry

## 2024-09-01 NOTE — Telephone Encounter (Signed)
 DOS- 10/02/2024  AMPUTATION TOE MPJ JOINT X1 LT- 71179 AMPUTATION TOE INTERPHALANGEAL X2 LT- 28825  [facility] EFFECTIVE DATE- 12/22/2023  DEDUCTIBLE- $0 REMAINING- $0 OOP- $3900 REMAINING- $2890 COINSURANCE- 0%  PER [employer] WEBSITE, NO PRIOR AUTHS ARE REQUIRED FOR CPT CODES 71179 AND [phone] (2 UNITS). DECSION ID# [id number]

## 2024-09-02 LAB — WOUND CULTURE
MICRO NUMBER:: 16943009
SPECIMEN QUALITY:: ADEQUATE

## 2024-09-04 ENCOUNTER — Ambulatory Visit: Payer: Self-pay | Admitting: Podiatry

## 2024-09-04 ENCOUNTER — Telehealth: Payer: Self-pay

## 2024-09-04 MED ORDER — LEVOFLOXACIN 750 MG PO TABS: 750.0000 mg | ORAL_TABLET | ORAL | 0 refills | Status: DC

## 2024-09-04 NOTE — Telephone Encounter (Signed)
 I have let Dawn know, per Dr. Magda the pt can hold Plavix  as they requested for pt's upcoming foot surgery and that if they are uncomfortable proceeding while on Plavix , Dr. Magda could do it for them. Dawn verbalized understanding.

## 2024-09-05 ENCOUNTER — Ambulatory Visit (HOSPITAL_COMMUNITY)

## 2024-09-11 ENCOUNTER — Ambulatory Visit (HOSPITAL_BASED_OUTPATIENT_CLINIC_OR_DEPARTMENT_OTHER)
Admission: RE | Admit: 2024-09-11 | Discharge: 2024-09-11 | Disposition: A | Discharge status: Home / Self Care | Source: Ambulatory Visit | Attending: Vascular Surgery | Admitting: Vascular Surgery

## 2024-09-11 ENCOUNTER — Ambulatory Visit (HOSPITAL_COMMUNITY)
Admission: RE | Admit: 2024-09-11 | Discharge: 2024-09-11 | Disposition: A | Discharge status: Home / Self Care | Source: Ambulatory Visit | Attending: Vascular Surgery | Admitting: Vascular Surgery

## 2024-09-11 DIAGNOSIS — I70262 Atherosclerosis of native arteries of extremities with gangrene, left leg: Secondary | ICD-10-CM | POA: Insufficient documentation

## 2024-09-11 LAB — VAS US ABI WITH/WO TBI
Left ABI: 0.75
Right ABI: 0.6

## 2024-09-12 ENCOUNTER — Encounter

## 2024-09-12 ENCOUNTER — Encounter: Payer: Self-pay | Admitting: Vascular Surgery

## 2024-09-19 ENCOUNTER — Ambulatory Visit (INDEPENDENT_AMBULATORY_CARE_PROVIDER_SITE_OTHER): Admitting: Physician Assistant

## 2024-09-19 VITALS — BP 197/82 | Ht 72.0 in | Wt 214.0 lb

## 2024-09-19 DIAGNOSIS — I70262 Atherosclerosis of native arteries of extremities with gangrene, left leg: Secondary | ICD-10-CM | POA: Diagnosis not present

## 2024-09-19 DIAGNOSIS — I739 Peripheral vascular disease, unspecified: Secondary | ICD-10-CM

## 2024-09-19 NOTE — Progress Notes (Signed)
 Office Note     CC:  follow up Requesting Provider:  Debrah Lenis, MD  HPI: George Jefferson is a 83 y.o. (December 22, 1940) male who presents status post aortogram with left femoral-popliteal angioplasty and stenting, left external iliac angioplasty and stenting, and right external iliac angioplasty and stenting by Dr. Magda on 08/11/2024 due to left lower extremity gangrene.  He believes the right groin cath site has healed.  Subjectively his foot feels much better than before surgery.  He believes the gangrenous toes of the left foot are slowly improving.  He follows regularly with his podiatrist Dr. Silva who is planning on toe debridements versus amputations on October 13.  He is on aspirin  and Plavix  daily.  He denies tobacco use.  He is cleansing the wounds with soap and water on a daily basis then painting with Betadine.   Past Medical History:  Diagnosis Date   Arm fracture, right    x 2   Cancer (HCC)    skin cancer right nose   Diabetes mellitus without complication [facility])    patient stated borderline   Hypertension    Pneumonia    Stroke [facility]) 2006    Past Surgical History:  Procedure Laterality Date   ABDOMINAL AORTOGRAM N/A 08/11/2024   Procedure: ABDOMINAL AORTOGRAM;  Surgeon: Magda Debby SAILOR, MD;  Location: [facility] INVASIVE CV LAB;  Service: Cardiovascular;  Laterality: N/A;   BACK SURGERY     CHOLECYSTECTOMY     cyst removed     spine   HERNIA REPAIR     KNEE SURGERY Left    LOWER EXTREMITY ANGIOGRAPHY N/A 08/11/2024   Procedure: Lower Extremity Angiography;  Surgeon: Magda Debby SAILOR, MD;  Location: [facility] INVASIVE CV LAB;  Service: Cardiovascular;  Laterality: N/A;   LOWER EXTREMITY INTERVENTION N/A 08/11/2024   Procedure: LOWER EXTREMITY INTERVENTION;  Surgeon: Magda Debby SAILOR, MD;  Location: [facility] INVASIVE CV LAB;  Service: Cardiovascular;  Laterality: N/A;   LUMBAR LAMINECTOMY/DECOMPRESSION MICRODISCECTOMY N/A 09/04/2022   Procedure: Sublaminar decompression Lumbar one-two;   Surgeon: Gillie Duncans, MD;  Location: [facility] OR;  Service: Neurosurgery;  Laterality: N/A;   NECK SURGERY     x 3 fusions   SKIN CANCER EXCISION Right    nose   TONSILLECTOMY      Social History   Socioeconomic History   Marital status: Widowed    Spouse name: Not on file   Number of children: 2   Years of education: Not on file   Highest education level: Not on file  Occupational History   Not on file  Tobacco Use   Smoking status: Former    Current packs/day: 0.00    Types: Cigarettes    Quit date: 12/21/2005    Years since quitting: 18.7   Smokeless tobacco: Never  Vaping Use   Vaping status: Never Used  Substance and Sexual Activity   Alcohol use: No   Drug use: No   Sexual activity: Not Currently  Other Topics Concern   Not on file  Social History Narrative   One daughter has passed   Social Drivers of Corporate investment banker Strain: Not on file  Food Insecurity: No Food Insecurity (07/10/2024)   Hunger Vital Sign    Worried About Running Out of Food in the Last Year: Never true    Ran Out of Food in the Last Year: Never true  Transportation Needs: No Transportation Needs (07/10/2024)   PRAPARE - Transportation    Lack of Transportation (  Medical): No    Lack of Transportation (Non-Medical): No  Physical Activity: Not on file  Stress: Not on file  Social Connections: Moderately Isolated (07/10/2024)   Social Connection and Isolation Panel    Frequency of Communication with Friends and Family: Twice a week    Frequency of Social Gatherings with Friends and Family: Twice a week    Attends Religious Services: 1 to 4 times per year    Active Member of [employer] or Organizations: No    Attends Banker Meetings: Never    Marital Status: Widowed  Intimate Partner Violence: Not At Risk (07/10/2024)   Humiliation, Afraid, Rape, and Kick questionnaire    Fear of Current or Ex-Partner: No    Emotionally Abused: No    Physically Abused: No    Sexually  Abused: No    Family History  Problem Relation Age of Onset   Diabetes Mother    Diabetes Sister    Diabetes Brother     Current Outpatient Medications  Medication Sig Dispense Refill   acetaminophen  (TYLENOL ) 500 MG tablet Take 500-1,000 mg by mouth every 6 (six) hours as needed (pain.).     aspirin  EC 81 MG tablet Take 81 mg by mouth daily. Swallow whole.     clopidogrel  (PLAVIX ) 75 MG tablet Take 1 tablet (75 mg total) by mouth daily. 30 tablet 11   folic acid  (FOLVITE ) 1 MG tablet Take 1 mg by mouth in the morning.     gentamicin  ointment (GARAMYCIN ) 0.1 % Apply 1 Application topically daily. Apply to wound daily 30 g 0   losartan -hydrochlorothiazide  (HYZAAR) 100-25 MG tablet Take 1 tablet by mouth daily.     metoprolol  succinate (TOPROL -XL) 50 MG 24 hr tablet Take 50 mg by mouth in the morning and at bedtime. Take with or immediately following a meal.     levofloxacin  (LEVAQUIN ) 750 MG tablet Take 1 tablet (750 mg total) by mouth every other day. (Patient not taking: Reported on 09/19/2024) 7 tablet 0   losartan  (COZAAR ) 100 MG tablet Take 0.5 tablets (50 mg total) by mouth daily. (Patient not taking: Reported on 09/19/2024) 30 tablet 1   No current facility-administered medications for this visit.    Allergies  Allergen Reactions   Ciprofloxacin Other (See Comments)    Liked to eat my stomach out.    Keflex [Cephalexin] Nausea Only     REVIEW OF SYSTEMS:   [X]  denotes positive finding, [ ]  denotes negative finding Cardiac  Comments:  Chest pain or chest pressure:    Shortness of breath upon exertion:    Short of breath when lying flat:    Irregular heart rhythm:        Vascular    Pain in calf, thigh, or hip brought on by ambulation:    Pain in feet at night that wakes you up from your sleep:     Blood clot in your veins:    Leg swelling:         Pulmonary    Oxygen at home:    Productive cough:     Wheezing:         Neurologic    Sudden weakness in arms or  legs:     Sudden numbness in arms or legs:     Sudden onset of difficulty speaking or slurred speech:    Temporary loss of vision in one eye:     Problems with dizziness:         Gastrointestinal  Blood in stool:     Vomited blood:         Genitourinary    Burning when urinating:     Blood in urine:        Psychiatric    Major depression:         Hematologic    Bleeding problems:    Problems with blood clotting too easily:        Skin    Rashes or ulcers:        Constitutional    Fever or chills:      PHYSICAL EXAMINATION:  Vitals:   09/19/24 1104  BP: (!) 197/82  SpO2: 100%  Weight: 214 lb (97.1 kg)  Height: 6' (1.829 m)    General:  WDWN in NAD; vital signs documented above Gait: Not observed HENT: WNL, normocephalic Pulmonary: normal non-labored breathing Cardiac: regular HR Abdomen: soft, NT, no masses Skin: without rashes Vascular Exam/Pulses: R femoral 2+ without hematoma; PT signals by Doppler bilaterally Extremities: Gangrenous toes of the left foot pictured below Musculoskeletal: no muscle wasting or atrophy  Neurologic: A&O X 3 Psychiatric:  The pt has Normal affect.    Non-Invasive Vascular Imaging:    LEFT       PSV cm/sRatioStenosis       Waveform  Comments  +-----------+--------+-----+---------------+----------+--------+  CFA Prox   383          50-74% stenosisbiphasic            +-----------+--------+-----+---------------+----------+--------+  CFA Mid    180          30-49% stenosisbiphasic            +-----------+--------+-----+---------------+----------+--------+  CFA Distal 196          30-49% stenosismonophasic          +-----------+--------+-----+---------------+----------+--------+  DFA       259          50-74% stenosismonophasic          +-----------+--------+-----+---------------+----------+--------+  SFA Prox   200                         biphasic             +-----------+--------+-----+---------------+----------+--------+  SFA Mid    74                          biphasic            +-----------+--------+-----+---------------+----------+--------+  SFA Distal 70                          biphasic            +-----------+--------+-----+---------------+----------+--------+  POP Prox   94                          biphasic            +-----------+--------+-----+---------------+----------+--------+  POP Mid    77                          biphasic            +-----------+--------+-----+---------------+----------+--------+  POP Distal 97                          biphasic            +-----------+--------+-----+---------------+----------+--------+  TP Trunk   92                          biphasic            +-----------+--------+-----+---------------+----------+--------+  ATA Distal 100                         biphasic            +-----------+--------+-----+---------------+----------+--------+  PTA Distal 28                          monophasic          +-----------+--------+-----+---------------+----------+--------+  PERO Distal74                          biphasic            +-----------+--------+-----+---------------+----------+--------+     Left Stent(s):  +--------------------------+--------+--------------+----------+--------+  SFA prox to Pop prox stentPSV cm/sStenosis      Waveform  Comments  +--------------------------+--------+--------------+----------+--------+  Prox to Stent             196                   monophasic          +--------------------------+--------+--------------+----------+--------+  Proximal Stent            200     1-49% stenosisbiphasic            +--------------------------+--------+--------------+----------+--------+  Mid Stent                 70                    biphasic             +--------------------------+--------+--------------+----------+--------+  Distal Stent              86                    biphasic            +--------------------------+--------+--------------+----------+--------+  Distal to Stent           94                    biphasic            +--------------------------+--------+--------------+----------+--------+    ABI/TBIToday's ABIToday's TBI     Previous ABIPrevious TBI  +-------+-----------+----------------+------------+------------+  Right 0.60       0.44                                      +-------+-----------+----------------+------------+------------+  Left  0.75       unable to obtain                          +-------+-----------+----------------+------------+------   ASSESSMENT/PLAN:: 83 y.o. male status post left femoral-popliteal angioplasty and stenting, left external iliac artery angioplasty and stenting, and right external iliac artery angioplasty and stenting  Subjectively his left foot feels much better compared to before surgery.  He believes his toes are slowly improving.  He is scheduled for toe debridement versus amputation with podiatry in a couple weeks.  Imaging demonstrates widely patent left femoral-popliteal stent.  He will continue his aspirin  and Plavix   daily.  We will check an aortoiliac duplex as well as left lower extremity arterial duplex and ABI in another 3 months.  He will notify the office with any questions or concerns.   Donnice Sender, PA-C [facility] [place] [phone]

## 2024-09-20 ENCOUNTER — Encounter: Payer: Self-pay | Admitting: Podiatry

## 2024-09-20 DIAGNOSIS — I96 Gangrene, not elsewhere classified: Secondary | ICD-10-CM

## 2024-09-22 ENCOUNTER — Ambulatory Visit (HOSPITAL_COMMUNITY)
Admission: RE | Admit: 2024-09-22 | Discharge: 2024-09-22 | Disposition: A | Discharge status: Home / Self Care | Source: Ambulatory Visit | Attending: Podiatry | Admitting: Podiatry

## 2024-09-22 ENCOUNTER — Other Ambulatory Visit: Payer: Self-pay | Admitting: Vascular Surgery

## 2024-09-22 DIAGNOSIS — I96 Gangrene, not elsewhere classified: Secondary | ICD-10-CM | POA: Insufficient documentation

## 2024-09-22 DIAGNOSIS — I70262 Atherosclerosis of native arteries of extremities with gangrene, left leg: Secondary | ICD-10-CM

## 2024-09-22 DIAGNOSIS — I739 Peripheral vascular disease, unspecified: Secondary | ICD-10-CM

## 2024-09-27 NOTE — Telephone Encounter (Signed)
 Appointment for 10/21 has been modified and all other post-ops cancelled.

## 2024-10-10 ENCOUNTER — Encounter: Payer: Self-pay | Admitting: Vascular Surgery

## 2024-10-10 ENCOUNTER — Ambulatory Visit (INDEPENDENT_AMBULATORY_CARE_PROVIDER_SITE_OTHER): Admitting: Podiatry

## 2024-10-10 VITALS — Ht 72.0 in | Wt 214.0 lb

## 2024-10-10 DIAGNOSIS — I96 Gangrene, not elsewhere classified: Secondary | ICD-10-CM | POA: Diagnosis not present

## 2024-10-15 NOTE — Progress Notes (Signed)
  Subjective:  Patient ID: George Jefferson, male    DOB: 11-06-1941,  MRN: [id number]  Chief Complaint  Patient presents with   Wound Check    RM 2 Patient is here for gangrene of toe on the left foot. Left 2nd toe has moderate drainage, left 1st and 3rd toes is scabbed over with no visible drainage. Pt request nail trimming today.    Discussed the use of AI scribe software for clinical note transcription with the patient, who gave verbal consent to proceed.  History of Present Illness George Jefferson is an 83 year old male who presents with gangrenous changes of multiple toes.  They have noted improvement      Objective:    Physical Exam VASCULAR: DP and PT pulse nonpalpable. Foot is warm and the midfoot DERMATOLOGIC: Normal skin turgor, texture, and temperature.  Gangrenous changes in toes NEUROLOGIC: Normal sensation to light touch and pressure. No paresthesias on examination. ORTHOPEDIC:  No pain to palpation. EXTREMITIES: Gangrenous changes in multiple toes, has had some improvement no cellulitis no purulence or malodor today       Results LABS A1c: 6.8  Procedure: Wound culture Description: Culture obtained from drainage of toes   Assessment:   1. Gangrene of toe of left foot (HCC)      Plan:  Patient was evaluated and treated and all questions answered.  Assessment and Plan Assessment & Plan Burn and gangrene of right toes He returns for follow-up today has had some improvement debrided the gangrenous portions sharply to remove the eschar, still may have some areas requiring partial amputation in the future if not healing but has had significant progress since my last evaluation of him.  Follow-up in 3 to 4 weeks to reevaluate.      No follow-ups on file.

## 2024-10-24 ENCOUNTER — Encounter: Admitting: Podiatry

## 2024-11-02 ENCOUNTER — Ambulatory Visit: Admitting: Podiatry

## 2024-11-02 VITALS — Ht 72.0 in | Wt 214.0 lb

## 2024-11-02 DIAGNOSIS — L97524 Non-pressure chronic ulcer of other part of left foot with necrosis of bone: Secondary | ICD-10-CM | POA: Diagnosis not present

## 2024-11-02 DIAGNOSIS — I96 Gangrene, not elsewhere classified: Secondary | ICD-10-CM

## 2024-11-05 ENCOUNTER — Encounter: Payer: Self-pay | Admitting: Podiatry

## 2024-11-05 NOTE — Progress Notes (Signed)
  Subjective:  Patient ID: CASEN PRYOR, male    DOB: [birth year],  MRN: [id number]  Chief Complaint  Patient presents with   Wound Check    RM 16 Patient is here for gangrene of toes of left foot. Pt is requesting new surgical shoe.    Discussed the use of AI scribe software for clinical note transcription with the patient, who gave verbal consent to proceed.  History of Present Illness KORBY RATAY is an 83 year old male who presents with gangrenous changes of multiple toes.  They have noted improvement      Objective:    Physical Exam VASCULAR: DP and PT pulse nonpalpable. Foot is warm and the midfoot.  Better capillary fill time in the toes today. DERMATOLOGIC: Normal skin turgor, texture, and temperature.  Gangrenous changes in toes NEUROLOGIC: Normal sensation to light touch and pressure. No paresthesias on examination. ORTHOPEDIC:  No pain to palpation. EXTREMITIES: Full-thickness ulcerations in the 1st, 2nd and 3rd toes of the left foot measuring 1.5 x 2.0 cm for the hallux, 4.0 x 1.5 cm for the second toe and 2.0 x 1.0 cm for the third toe.  Exposed bone distally.  No purulence or signs of acute local or systemic infection       Results LABS A1c: 6.8  Procedure: Wound culture Description: Culture obtained from drainage of toes   Assessment:   1. Toe ulcer, left, with necrosis of bone (HCC)   2. Gangrene of toe of left foot (HCC)      Plan:  Patient was evaluated and treated and all questions answered.  Assessment and Plan Assessment & Plan Burn and gangrene of right toes He returns for follow-up today has had some improvement.  Sharp full-thickness excisional debridement of the eschar subcutaneous tissue tendon and bone was completed today of the exposed portions.  I discussed with the patient and his daughter the exposed bone there is likely osteomyelitis but they do seem to be healing well and granulating over the bone gradually.  As long as he does  not develop acute infection then can continue local wound care discussed with her at some point may need to still consider partial amputation.  Excisional sharp debridement utilizing a tissue nipper to the level of bone was completed today.  Postdebridement measurements are noted above.  Predebridement measurements were not accurate due to overlying eschar still present which I excised today as well as slough nonviable tissue and hyperkeratosis.  Hemostasis achieved mainly.  It was dressed with Prisma collagen dressings gauze and gauze roll.  Prisma was dispensed to take home.  New surgical shoe had to be dispensed today due to failure of the previous surgical shoe as a replacement today.  Follow-up with me in 3 weeks to reevaluate.  Advised on signs and symptoms of worsening local or systemic infection and they will notify me immediately if this develops or proceed to the ER.      Return in about 3 weeks (around 11/23/2024) for wound care.

## 2024-11-14 ENCOUNTER — Encounter: Admitting: Podiatry

## 2024-12-05 ENCOUNTER — Ambulatory Visit: Admitting: Podiatry

## 2024-12-05 DIAGNOSIS — I96 Gangrene, not elsewhere classified: Secondary | ICD-10-CM

## 2024-12-05 DIAGNOSIS — E11621 Type 2 diabetes mellitus with foot ulcer: Secondary | ICD-10-CM | POA: Diagnosis not present

## 2024-12-05 DIAGNOSIS — L97529 Non-pressure chronic ulcer of other part of left foot with unspecified severity: Secondary | ICD-10-CM | POA: Diagnosis not present

## 2024-12-05 MED ORDER — LEVOFLOXACIN 500 MG PO TABS: 500.0000 mg | ORAL_TABLET | Freq: Every day | ORAL | 0 refills | Status: AC

## 2024-12-06 ENCOUNTER — Encounter: Payer: Self-pay | Admitting: Podiatry

## 2024-12-06 NOTE — Progress Notes (Signed)
°  Subjective:  Patient ID: George Jefferson, male    DOB: [birth year],  MRN: [id number]  Chief Complaint  Patient presents with   Wound Check    L 1-3 toes wound check. Still has swelling, redness and drainage of 2nd toe.       Discussed the use of AI scribe software for clinical note transcription with the patient, who gave verbal consent to proceed.  History of Present Illness EDRIAN MELUCCI is an 83 year old male who presents with gangrenous changes of multiple toes.  They have noted improvement      Objective:    Physical Exam VASCULAR: DP and PT pulse nonpalpable. Foot is warm and the midfoot.  Better capillary fill time in the toes today. DERMATOLOGIC: Normal skin turgor, texture, and temperature.  Gangrenous changes in toes NEUROLOGIC: Normal sensation to light touch and pressure. No paresthesias on examination. ORTHOPEDIC:  No pain to palpation. EXTREMITIES: Full-thickness ulcerations in the 1st, 2nd and 3rd toes of the left foot measuring 0.83 by x 1.0 cm for the hallux, 9 x 1.0 cm for the second toe and 0.7 x 0.7 cm for the third toe.  Exposed bone distally.  No purulence or signs of acute local or systemic infection            Results LABS A1c: 6.8  Procedure: Wound culture Description: Culture obtained from drainage of toes   Assessment:   1. Gangrene of toe of left foot (HCC)   2. 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  Assessment and Plan Assessment & Plan Burn and gangrene of right toes He returns for follow-up today has had some improvement.  Sharp full-thickness excisional debridement of the eschar subcutaneous tissue tendon and bone was completed today of the exposed portions.  I discussed with the patient and his daughter the exposed bone there is likely osteomyelitis but they do seem to be healing well and granulating over the bone gradually.  As long  as he does not develop acute infection then can continue local wound care discussed with her at some point may need to still consider partial amputation.  Excisional sharp debridement utilizing a tissue nipper to the level of bone was completed today.  Postdebridement measurements are noted above.   Hemostasis achieved manually.  Dry sterile dressings applied today.  We discussed a repeat course of levofloxacin , Rx sent to pharmacy and base metabolic panel ordered they want to check to see what his recent lab work with his PCP was first      Return in about 3 weeks (around 12/26/2024) for wound care, Xrays left foot.

## 2024-12-26 ENCOUNTER — Ambulatory Visit (INDEPENDENT_AMBULATORY_CARE_PROVIDER_SITE_OTHER)

## 2024-12-26 ENCOUNTER — Ambulatory Visit: Admitting: Podiatry

## 2024-12-26 VITALS — Ht 72.0 in | Wt 214.0 lb

## 2024-12-26 DIAGNOSIS — I96 Gangrene, not elsewhere classified: Secondary | ICD-10-CM | POA: Diagnosis not present

## 2024-12-26 NOTE — Progress Notes (Signed)
"  °  Subjective:  Patient ID: George Jefferson, male    DOB: 01-20-1941,  MRN: [id number]  Chief Complaint  Patient presents with   Foot Problem    RM 4 Patient is here for ganagrene of the left foot.     Discussed the use of AI scribe software for clinical note transcription with the patient, who gave verbal consent to proceed.  History of Present Illness George Jefferson is an 84 year old male who presents with gangrenous changes of multiple toes.  They have noted improvement      Objective:    Physical Exam VASCULAR: DP and PT pulse nonpalpable. Foot is warm and the midfoot.  Better capillary fill time in the toes today. DERMATOLOGIC: Normal skin turgor, texture, and temperature.  Gangrenous changes in toes NEUROLOGIC: Normal sensation to light touch and pressure. No paresthesias on examination. ORTHOPEDIC:  No pain to palpation. EXTREMITIES: Full-thickness ulcerations in the 1st, 2nd and 3rd toes of the left foot measuring 0.8 x 0.5 x 0.5 cm for the hallux, 2.0 x 1.0 cm for the second toe and 0.3 x 0.3 cm for the third toe.  Exposed bone distally.  No purulence or signs of acute local or systemic infection               Results LABS A1c: 6.8  Procedure: Wound culture Description: Culture obtained from drainage of toes  New radiographs taken today shows interval osteolysis of the distal middle phalanx of the 2nd and 3rd toes and to dispense of the first toe but no gaseous formation   Assessment:   1. Gangrene of toe of left foot (HCC)      Plan:  Patient was evaluated and treated and all questions answered.  Assessment and Plan Assessment & Plan Burn and gangrene of right toes He returns for follow-up today has had some improvement.  Sharp full-thickness excisional debridement of the eschar subcutaneous tissue tendon and bone was completed today of the exposed portions.  I discussed with the patient and his daughter the exposed bone there is likely  osteomyelitis but they do seem to be healing well and granulating over the bone gradually.  As long as he does not develop acute infection then can continue local wound care discussed with her at some point may need to still consider partial amputation.  Excisional sharp debridement utilizing a tissue nipper to the level of bone was completed today.  Postdebridement measurements are noted above.   Hemostasis achieved manually.  Dry sterile dressings applied today.  New dressing with Betadine.  Likely will switch to Prisma collagen dressings at next visit.  We reviewed his x-rays which does show some osteolysis and postdebridement changes of the bone but no worsening erosion to indicate that there is progressive bone infection.  Remarkably his wounds continue to heal quite well and the third toe is nearly fully healed.  Follow-up with me in 3 to 4 weeks to reevaluate.      No follow-ups on file.   "

## 2025-01-09 ENCOUNTER — Ambulatory Visit (INDEPENDENT_AMBULATORY_CARE_PROVIDER_SITE_OTHER)

## 2025-01-09 ENCOUNTER — Ambulatory Visit

## 2025-01-09 DIAGNOSIS — I70262 Atherosclerosis of native arteries of extremities with gangrene, left leg: Secondary | ICD-10-CM | POA: Diagnosis not present

## 2025-01-09 DIAGNOSIS — I739 Peripheral vascular disease, unspecified: Secondary | ICD-10-CM

## 2025-01-09 LAB — VAS US ABI WITH/WO TBI
Left ABI: 0.84
Right ABI: 0.66

## 2025-01-16 ENCOUNTER — Ambulatory Visit

## 2025-01-22 ENCOUNTER — Encounter: Payer: Self-pay | Admitting: Podiatry

## 2025-01-23 ENCOUNTER — Ambulatory Visit: Admitting: Podiatry

## 2025-02-06 ENCOUNTER — Ambulatory Visit: Admitting: Vascular Surgery

## 2025-02-08 ENCOUNTER — Ambulatory Visit: Admitting: Podiatry

## 2025-03-01 ENCOUNTER — Ambulatory Visit: Admitting: Podiatry

## 2025-03-06 ENCOUNTER — Ambulatory Visit: Admitting: Podiatry
